# Patient Record
Sex: Female | Born: 1935 | Race: White | Hispanic: No | Smoking: Current every day smoker
Health system: Southern US, Community
[De-identification: ages and names within clinical notes are randomized; demographics above are authoritative.]

## PROBLEM LIST (undated history)

## (undated) DIAGNOSIS — F17200 Nicotine dependence, unspecified, uncomplicated: Secondary | ICD-10-CM

## (undated) DIAGNOSIS — E119 Type 2 diabetes mellitus without complications: Secondary | ICD-10-CM

## (undated) DIAGNOSIS — E785 Hyperlipidemia, unspecified: Secondary | ICD-10-CM

## (undated) DIAGNOSIS — M810 Age-related osteoporosis without current pathological fracture: Secondary | ICD-10-CM

## (undated) DIAGNOSIS — I10 Essential (primary) hypertension: Secondary | ICD-10-CM

## (undated) DIAGNOSIS — S61409A Unspecified open wound of unspecified hand, initial encounter: Secondary | ICD-10-CM

## (undated) DIAGNOSIS — I872 Venous insufficiency (chronic) (peripheral): Secondary | ICD-10-CM

## (undated) DIAGNOSIS — Z9079 Acquired absence of other genital organ(s): Secondary | ICD-10-CM

## (undated) HISTORY — DX: Nicotine dependence, unspecified, uncomplicated: F17.200

## (undated) HISTORY — DX: Age-related osteoporosis without current pathological fracture: M81.0

## (undated) HISTORY — DX: Unspecified open wound of unspecified hand, initial encounter: S61.409A

## (undated) HISTORY — DX: Type 2 diabetes mellitus without complications: E11.9

## (undated) HISTORY — DX: Acquired absence of other genital organ(s): Z90.79

## (undated) HISTORY — DX: Hypercalcemia: E83.52

## (undated) HISTORY — DX: Venous insufficiency (chronic) (peripheral): I87.2

## (undated) HISTORY — DX: Essential (primary) hypertension: I10

## (undated) HISTORY — DX: Hyperlipidemia, unspecified: E78.5

---

## 1999-10-15 ENCOUNTER — Encounter: Payer: Self-pay | Admitting: Emergency Medicine

## 1999-10-16 ENCOUNTER — Inpatient Hospital Stay (HOSPITAL_COMMUNITY): Admission: EM | Admit: 1999-10-16 | Discharge: 1999-10-18 | Payer: Self-pay | Admitting: Emergency Medicine

## 1999-10-24 ENCOUNTER — Encounter: Admission: RE | Admit: 1999-10-24 | Discharge: 1999-10-24 | Payer: Self-pay | Admitting: Internal Medicine

## 1999-10-25 ENCOUNTER — Encounter: Admission: RE | Admit: 1999-10-25 | Discharge: 2000-01-23 | Payer: Self-pay | Admitting: *Deleted

## 1999-11-02 ENCOUNTER — Encounter: Admission: RE | Admit: 1999-11-02 | Discharge: 1999-11-02 | Payer: Self-pay | Admitting: Internal Medicine

## 1999-11-07 ENCOUNTER — Encounter: Admission: RE | Admit: 1999-11-07 | Discharge: 1999-11-07 | Payer: Self-pay | Admitting: Internal Medicine

## 1999-11-08 ENCOUNTER — Encounter: Admission: RE | Admit: 1999-11-08 | Discharge: 1999-11-08 | Payer: Self-pay | Admitting: Hematology and Oncology

## 1999-11-17 ENCOUNTER — Encounter: Payer: Self-pay | Admitting: Emergency Medicine

## 1999-11-17 ENCOUNTER — Emergency Department (HOSPITAL_COMMUNITY): Admission: EM | Admit: 1999-11-17 | Discharge: 1999-11-17 | Payer: Self-pay | Admitting: Emergency Medicine

## 1999-12-21 ENCOUNTER — Encounter: Admission: RE | Admit: 1999-12-21 | Discharge: 1999-12-21 | Payer: Self-pay | Admitting: Internal Medicine

## 2000-01-02 ENCOUNTER — Ambulatory Visit (HOSPITAL_COMMUNITY): Admission: RE | Admit: 2000-01-02 | Discharge: 2000-01-02 | Payer: Self-pay | Admitting: Internal Medicine

## 2000-01-02 ENCOUNTER — Encounter: Payer: Self-pay | Admitting: Internal Medicine

## 2000-03-28 ENCOUNTER — Encounter: Admission: RE | Admit: 2000-03-28 | Discharge: 2000-03-28 | Payer: Self-pay

## 2000-07-17 ENCOUNTER — Encounter: Admission: RE | Admit: 2000-07-17 | Discharge: 2000-07-17 | Payer: Self-pay | Admitting: Internal Medicine

## 2000-08-16 ENCOUNTER — Encounter: Admission: RE | Admit: 2000-08-16 | Discharge: 2000-08-16 | Payer: Self-pay | Admitting: Internal Medicine

## 2000-09-14 ENCOUNTER — Encounter: Admission: RE | Admit: 2000-09-14 | Discharge: 2000-09-14 | Payer: Self-pay | Admitting: Internal Medicine

## 2001-03-22 ENCOUNTER — Encounter: Admission: RE | Admit: 2001-03-22 | Discharge: 2001-03-22 | Payer: Self-pay | Admitting: Internal Medicine

## 2001-04-30 ENCOUNTER — Encounter: Admission: RE | Admit: 2001-04-30 | Discharge: 2001-04-30 | Payer: Self-pay

## 2001-07-12 ENCOUNTER — Encounter: Admission: RE | Admit: 2001-07-12 | Discharge: 2001-07-12 | Payer: Self-pay | Admitting: Internal Medicine

## 2001-07-30 ENCOUNTER — Encounter: Admission: RE | Admit: 2001-07-30 | Discharge: 2001-07-30 | Payer: Self-pay | Admitting: Internal Medicine

## 2001-08-01 ENCOUNTER — Ambulatory Visit (HOSPITAL_COMMUNITY): Admission: RE | Admit: 2001-08-01 | Discharge: 2001-08-01 | Payer: Self-pay | Admitting: Internal Medicine

## 2001-08-01 ENCOUNTER — Encounter: Payer: Self-pay | Admitting: Internal Medicine

## 2002-05-02 ENCOUNTER — Encounter: Admission: RE | Admit: 2002-05-02 | Discharge: 2002-05-02 | Payer: Self-pay | Admitting: Internal Medicine

## 2002-09-22 ENCOUNTER — Encounter: Admission: RE | Admit: 2002-09-22 | Discharge: 2002-09-22 | Payer: Self-pay | Admitting: Internal Medicine

## 2002-09-24 ENCOUNTER — Encounter: Admission: RE | Admit: 2002-09-24 | Discharge: 2002-09-24 | Payer: Self-pay | Admitting: Internal Medicine

## 2002-09-24 ENCOUNTER — Encounter: Payer: Self-pay | Admitting: Internal Medicine

## 2002-09-25 ENCOUNTER — Ambulatory Visit (HOSPITAL_COMMUNITY): Admission: RE | Admit: 2002-09-25 | Discharge: 2002-09-25 | Payer: Self-pay | Admitting: Internal Medicine

## 2002-09-26 ENCOUNTER — Encounter: Admission: RE | Admit: 2002-09-26 | Discharge: 2002-09-26 | Payer: Self-pay | Admitting: Internal Medicine

## 2003-02-04 ENCOUNTER — Encounter: Admission: RE | Admit: 2003-02-04 | Discharge: 2003-02-04 | Payer: Self-pay | Admitting: Internal Medicine

## 2003-03-20 ENCOUNTER — Encounter: Admission: RE | Admit: 2003-03-20 | Discharge: 2003-03-20 | Payer: Self-pay | Admitting: Internal Medicine

## 2003-12-25 ENCOUNTER — Emergency Department (HOSPITAL_COMMUNITY): Admission: EM | Admit: 2003-12-25 | Discharge: 2003-12-25 | Payer: Self-pay | Admitting: Emergency Medicine

## 2003-12-28 ENCOUNTER — Ambulatory Visit: Payer: Self-pay | Admitting: Internal Medicine

## 2004-02-04 ENCOUNTER — Ambulatory Visit: Payer: Self-pay | Admitting: Internal Medicine

## 2004-02-11 ENCOUNTER — Ambulatory Visit: Payer: Self-pay | Admitting: Internal Medicine

## 2004-02-17 ENCOUNTER — Ambulatory Visit (HOSPITAL_COMMUNITY): Admission: RE | Admit: 2004-02-17 | Discharge: 2004-02-17 | Payer: Self-pay | Admitting: *Deleted

## 2005-02-17 ENCOUNTER — Ambulatory Visit (HOSPITAL_COMMUNITY): Admission: RE | Admit: 2005-02-17 | Discharge: 2005-02-17 | Payer: Self-pay | Admitting: Internal Medicine

## 2005-03-17 ENCOUNTER — Ambulatory Visit: Payer: Self-pay | Admitting: Internal Medicine

## 2005-07-24 ENCOUNTER — Ambulatory Visit: Payer: Self-pay | Admitting: Internal Medicine

## 2005-08-01 ENCOUNTER — Ambulatory Visit: Payer: Self-pay | Admitting: Internal Medicine

## 2005-08-07 ENCOUNTER — Ambulatory Visit: Payer: Self-pay | Admitting: Internal Medicine

## 2005-08-11 ENCOUNTER — Ambulatory Visit (HOSPITAL_COMMUNITY): Admission: RE | Admit: 2005-08-11 | Discharge: 2005-08-11 | Payer: Self-pay | Admitting: Internal Medicine

## 2006-01-10 ENCOUNTER — Ambulatory Visit: Payer: Self-pay | Admitting: Internal Medicine

## 2006-02-21 ENCOUNTER — Ambulatory Visit (HOSPITAL_COMMUNITY): Admission: RE | Admit: 2006-02-21 | Discharge: 2006-02-21 | Payer: Self-pay | Admitting: Internal Medicine

## 2006-07-08 ENCOUNTER — Emergency Department (HOSPITAL_COMMUNITY): Admission: EM | Admit: 2006-07-08 | Discharge: 2006-07-08 | Payer: Self-pay | Admitting: Family Medicine

## 2006-07-10 DIAGNOSIS — Z87440 Personal history of urinary (tract) infections: Secondary | ICD-10-CM | POA: Insufficient documentation

## 2006-08-08 ENCOUNTER — Telehealth (INDEPENDENT_AMBULATORY_CARE_PROVIDER_SITE_OTHER): Payer: Self-pay | Admitting: *Deleted

## 2006-08-14 ENCOUNTER — Encounter (INDEPENDENT_AMBULATORY_CARE_PROVIDER_SITE_OTHER): Payer: Self-pay | Admitting: Unknown Physician Specialty

## 2006-08-14 ENCOUNTER — Ambulatory Visit: Payer: Self-pay | Admitting: Internal Medicine

## 2006-08-14 DIAGNOSIS — F17200 Nicotine dependence, unspecified, uncomplicated: Secondary | ICD-10-CM | POA: Insufficient documentation

## 2006-08-14 DIAGNOSIS — Z9079 Acquired absence of other genital organ(s): Secondary | ICD-10-CM

## 2006-08-14 DIAGNOSIS — E119 Type 2 diabetes mellitus without complications: Secondary | ICD-10-CM

## 2006-08-14 DIAGNOSIS — I10 Essential (primary) hypertension: Secondary | ICD-10-CM

## 2006-08-14 DIAGNOSIS — R05 Cough: Secondary | ICD-10-CM

## 2006-08-14 DIAGNOSIS — E785 Hyperlipidemia, unspecified: Secondary | ICD-10-CM

## 2006-08-14 DIAGNOSIS — I872 Venous insufficiency (chronic) (peripheral): Secondary | ICD-10-CM

## 2006-08-14 DIAGNOSIS — M810 Age-related osteoporosis without current pathological fracture: Secondary | ICD-10-CM | POA: Insufficient documentation

## 2006-08-14 HISTORY — DX: Type 2 diabetes mellitus without complications: E11.9

## 2006-08-14 HISTORY — DX: Venous insufficiency (chronic) (peripheral): I87.2

## 2006-08-14 HISTORY — DX: Age-related osteoporosis without current pathological fracture: M81.0

## 2006-08-14 HISTORY — DX: Nicotine dependence, unspecified, uncomplicated: F17.200

## 2006-08-14 HISTORY — DX: Hyperlipidemia, unspecified: E78.5

## 2006-08-14 HISTORY — DX: Essential (primary) hypertension: I10

## 2006-08-14 HISTORY — DX: Acquired absence of other genital organ(s): Z90.79

## 2006-08-14 LAB — CONVERTED CEMR LAB
Blood Glucose, Fingerstick: 187
Hgb A1c MFr Bld: 7.6 %

## 2006-08-16 ENCOUNTER — Ambulatory Visit: Payer: Self-pay | Admitting: Internal Medicine

## 2006-08-16 ENCOUNTER — Encounter (INDEPENDENT_AMBULATORY_CARE_PROVIDER_SITE_OTHER): Payer: Self-pay | Admitting: Unknown Physician Specialty

## 2006-08-16 LAB — CONVERTED CEMR LAB
ALT: 13 units/L (ref 0–35)
AST: 15 units/L (ref 0–37)
Albumin: 3.9 g/dL (ref 3.5–5.2)
Alkaline Phosphatase: 72 units/L (ref 39–117)
BUN: 19 mg/dL (ref 6–23)
CO2: 26 meq/L (ref 19–32)
Calcium: 10 mg/dL (ref 8.4–10.5)
Chloride: 102 meq/L (ref 96–112)
Cholesterol: 169 mg/dL (ref 0–200)
Creatinine, Ser: 0.97 mg/dL (ref 0.40–1.20)
Glucose, Bld: 179 mg/dL — ABNORMAL HIGH (ref 70–99)
HDL: 48 mg/dL (ref >39)
LDL Cholesterol: 93 mg/dL (ref 0–99)
Potassium: 4.1 meq/L (ref 3.5–5.3)
Sodium: 139 meq/L (ref 135–145)
Total Bilirubin: 0.5 mg/dL (ref 0.3–1.2)
Total CHOL/HDL Ratio: 3.5
Total Protein: 7.2 g/dL (ref 6.0–8.3)
Triglycerides: 142 mg/dL (ref <150)
VLDL: 28 mg/dL (ref 0–40)

## 2006-08-28 ENCOUNTER — Ambulatory Visit: Payer: Self-pay | Admitting: Hospitalist

## 2007-02-27 ENCOUNTER — Ambulatory Visit (HOSPITAL_COMMUNITY): Admission: RE | Admit: 2007-02-27 | Discharge: 2007-02-27 | Payer: Self-pay | Admitting: Infectious Diseases

## 2007-03-12 ENCOUNTER — Telehealth: Payer: Self-pay | Admitting: *Deleted

## 2007-03-15 ENCOUNTER — Encounter (INDEPENDENT_AMBULATORY_CARE_PROVIDER_SITE_OTHER): Payer: Self-pay | Admitting: Internal Medicine

## 2007-03-15 ENCOUNTER — Ambulatory Visit: Payer: Self-pay

## 2007-03-15 DIAGNOSIS — N390 Urinary tract infection, site not specified: Secondary | ICD-10-CM

## 2007-03-17 LAB — CONVERTED CEMR LAB
Ketones, ur: NEGATIVE mg/dL
Nitrite: NEGATIVE
Protein, ur: NEGATIVE mg/dL
Specific Gravity, Urine: 1.021 (ref 1.005–1.03)
Urine Glucose: NEGATIVE mg/dL
Urobilinogen, UA: 1 (ref 0.0–1.0)
pH: 6 (ref 5.0–8.0)

## 2007-04-09 ENCOUNTER — Telehealth: Payer: Self-pay | Admitting: *Deleted

## 2007-08-01 ENCOUNTER — Ambulatory Visit: Payer: Self-pay | Admitting: Internal Medicine

## 2007-08-01 DIAGNOSIS — S61409A Unspecified open wound of unspecified hand, initial encounter: Secondary | ICD-10-CM

## 2007-08-01 HISTORY — DX: Unspecified open wound of unspecified hand, initial encounter: S61.409A

## 2007-08-01 LAB — CONVERTED CEMR LAB
Blood Glucose, Fingerstick: 142
Hgb A1c MFr Bld: 7 %

## 2007-08-08 ENCOUNTER — Ambulatory Visit: Payer: Self-pay | Admitting: Internal Medicine

## 2007-08-08 ENCOUNTER — Encounter (INDEPENDENT_AMBULATORY_CARE_PROVIDER_SITE_OTHER): Payer: Self-pay | Admitting: Internal Medicine

## 2007-08-08 LAB — CONVERTED CEMR LAB
ALT: 9 units/L (ref 0–35)
AST: 13 units/L (ref 0–37)
Albumin: 4.3 g/dL (ref 3.5–5.2)
Alkaline Phosphatase: 79 units/L (ref 39–117)
BUN: 17 mg/dL (ref 6–23)
Basophils Absolute: 0 10*3/uL (ref 0.0–0.1)
Basophils Relative: 0 % (ref 0–1)
Bilirubin, Direct: 0.1 mg/dL (ref 0.0–0.3)
CO2: 26 meq/L (ref 19–32)
Calcium: 10.6 mg/dL — ABNORMAL HIGH (ref 8.4–10.5)
Chloride: 101 meq/L (ref 96–112)
Cholesterol: 187 mg/dL (ref 0–200)
Creatinine, Ser: 0.92 mg/dL (ref 0.40–1.20)
Eosinophils Absolute: 0.3 10*3/uL (ref 0.0–0.7)
Eosinophils Relative: 4 % (ref 0–5)
Glucose, Bld: 136 mg/dL — ABNORMAL HIGH (ref 70–99)
HCT: 43.8 % (ref 36.0–46.0)
HDL: 48 mg/dL (ref >39)
Hemoglobin: 14.5 g/dL (ref 12.0–15.0)
Indirect Bilirubin: 0.4 mg/dL (ref 0.0–0.9)
LDL Cholesterol: 103 mg/dL — ABNORMAL HIGH (ref 0–99)
Lymphocytes Relative: 32 % (ref 12–46)
Lymphs Abs: 2.2 10*3/uL (ref 0.7–4.0)
MCHC: 33.1 g/dL (ref 30.0–36.0)
MCV: 91.4 fL (ref 78.0–100.0)
Monocytes Absolute: 0.5 10*3/uL (ref 0.1–1.0)
Monocytes Relative: 7 % (ref 3–12)
Neutro Abs: 3.9 10*3/uL (ref 1.7–7.7)
Neutrophils Relative %: 57 % (ref 43–77)
Platelets: 231 10*3/uL (ref 150–400)
Potassium: 3.9 meq/L (ref 3.5–5.3)
RBC: 4.79 M/uL (ref 3.87–5.11)
RDW: 14.1 % (ref 11.5–15.5)
Sodium: 140 meq/L (ref 135–145)
Total Bilirubin: 0.5 mg/dL (ref 0.3–1.2)
Total CHOL/HDL Ratio: 3.9
Total Protein: 7.5 g/dL (ref 6.0–8.3)
Triglycerides: 182 mg/dL — ABNORMAL HIGH (ref <150)
VLDL: 36 mg/dL (ref 0–40)
WBC: 6.9 10*3/uL (ref 4.0–10.5)

## 2008-03-03 ENCOUNTER — Ambulatory Visit (HOSPITAL_COMMUNITY): Admission: RE | Admit: 2008-03-03 | Discharge: 2008-03-03 | Payer: Self-pay | Admitting: Infectious Diseases

## 2008-03-11 ENCOUNTER — Encounter (INDEPENDENT_AMBULATORY_CARE_PROVIDER_SITE_OTHER): Payer: Self-pay | Admitting: Internal Medicine

## 2008-03-11 ENCOUNTER — Ambulatory Visit: Payer: Self-pay | Admitting: Internal Medicine

## 2008-03-11 LAB — CONVERTED CEMR LAB
ALT: 15 units/L (ref 0–35)
AST: 17 units/L (ref 0–37)
Albumin: 4.2 g/dL (ref 3.5–5.2)
Alkaline Phosphatase: 75 units/L (ref 39–117)
BUN: 20 mg/dL (ref 6–23)
Blood Glucose, Fingerstick: 162
CO2: 26 meq/L (ref 19–32)
Calcium: 10.3 mg/dL (ref 8.4–10.5)
Chloride: 101 meq/L (ref 96–112)
Creatinine, Ser: 0.95 mg/dL (ref 0.40–1.20)
Creatinine, Urine: 96.5 mg/dL
Glucose, Bld: 145 mg/dL — ABNORMAL HIGH (ref 70–99)
Hgb A1c MFr Bld: 7.2 %
Microalb Creat Ratio: 18.6 mg/g (ref 0.0–30.0)
Microalb, Ur: 1.79 mg/dL (ref 0.00–1.89)
Potassium: 3.5 meq/L (ref 3.5–5.3)
Sodium: 139 meq/L (ref 135–145)
Total Bilirubin: 0.5 mg/dL (ref 0.3–1.2)
Total Protein: 7.1 g/dL (ref 6.0–8.3)

## 2008-03-24 ENCOUNTER — Ambulatory Visit: Payer: Self-pay | Admitting: Internal Medicine

## 2008-03-24 LAB — CONVERTED CEMR LAB: OCCULT 3: NEGATIVE

## 2008-08-18 ENCOUNTER — Telehealth (INDEPENDENT_AMBULATORY_CARE_PROVIDER_SITE_OTHER): Payer: Self-pay | Admitting: Internal Medicine

## 2009-02-09 ENCOUNTER — Telehealth: Payer: Self-pay | Admitting: *Deleted

## 2009-02-15 ENCOUNTER — Telehealth (INDEPENDENT_AMBULATORY_CARE_PROVIDER_SITE_OTHER): Payer: Self-pay | Admitting: Internal Medicine

## 2009-02-22 ENCOUNTER — Telehealth: Payer: Self-pay | Admitting: *Deleted

## 2009-03-09 ENCOUNTER — Ambulatory Visit: Payer: Self-pay | Admitting: Internal Medicine

## 2009-03-09 ENCOUNTER — Encounter (INDEPENDENT_AMBULATORY_CARE_PROVIDER_SITE_OTHER): Payer: Self-pay | Admitting: Internal Medicine

## 2009-03-09 LAB — CONVERTED CEMR LAB
ALT: 9 units/L (ref 0–35)
AST: 13 units/L (ref 0–37)
Albumin: 4.4 g/dL (ref 3.5–5.2)
Alkaline Phosphatase: 72 units/L (ref 39–117)
BUN: 26 mg/dL — ABNORMAL HIGH (ref 6–23)
Blood Glucose, Fingerstick: 138
CO2: 25 meq/L (ref 19–32)
Calcium: 10.5 mg/dL (ref 8.4–10.5)
Chloride: 104 meq/L (ref 96–112)
Cholesterol: 153 mg/dL (ref 0–200)
Creatinine, Ser: 1.17 mg/dL (ref 0.40–1.20)
Glucose, Bld: 142 mg/dL — ABNORMAL HIGH (ref 70–99)
HDL: 45 mg/dL (ref >39)
Hgb A1c MFr Bld: 6.8 %
LDL Cholesterol: 77 mg/dL (ref 0–99)
Potassium: 3.9 meq/L (ref 3.5–5.3)
Sodium: 142 meq/L (ref 135–145)
Total Bilirubin: 0.5 mg/dL (ref 0.3–1.2)
Total CHOL/HDL Ratio: 3.4
Total Protein: 7.2 g/dL (ref 6.0–8.3)
Triglycerides: 157 mg/dL — ABNORMAL HIGH (ref <150)
VLDL: 31 mg/dL (ref 0–40)

## 2009-03-23 ENCOUNTER — Ambulatory Visit (HOSPITAL_COMMUNITY): Admission: RE | Admit: 2009-03-23 | Discharge: 2009-03-23 | Payer: Self-pay | Admitting: Internal Medicine

## 2009-03-30 ENCOUNTER — Encounter (INDEPENDENT_AMBULATORY_CARE_PROVIDER_SITE_OTHER): Payer: Self-pay | Admitting: Internal Medicine

## 2009-04-19 ENCOUNTER — Telehealth (INDEPENDENT_AMBULATORY_CARE_PROVIDER_SITE_OTHER): Payer: Self-pay | Admitting: Internal Medicine

## 2009-04-21 ENCOUNTER — Encounter (INDEPENDENT_AMBULATORY_CARE_PROVIDER_SITE_OTHER): Payer: Self-pay | Admitting: Internal Medicine

## 2009-04-23 ENCOUNTER — Encounter (INDEPENDENT_AMBULATORY_CARE_PROVIDER_SITE_OTHER): Payer: Self-pay | Admitting: Internal Medicine

## 2009-08-16 ENCOUNTER — Telehealth (INDEPENDENT_AMBULATORY_CARE_PROVIDER_SITE_OTHER): Payer: Self-pay | Admitting: *Deleted

## 2009-08-27 ENCOUNTER — Telehealth (INDEPENDENT_AMBULATORY_CARE_PROVIDER_SITE_OTHER): Payer: Self-pay | Admitting: Internal Medicine

## 2009-09-07 ENCOUNTER — Telehealth (INDEPENDENT_AMBULATORY_CARE_PROVIDER_SITE_OTHER): Payer: Self-pay | Admitting: Internal Medicine

## 2009-09-21 ENCOUNTER — Ambulatory Visit: Payer: Self-pay | Admitting: Internal Medicine

## 2009-09-21 LAB — CONVERTED CEMR LAB
Blood Glucose, Fingerstick: 170
Hgb A1c MFr Bld: 6.7 %

## 2009-09-27 LAB — CONVERTED CEMR LAB
ALT: 11 units/L (ref 0–35)
AST: 16 units/L (ref 0–37)
Albumin: 4.3 g/dL (ref 3.5–5.2)
Alkaline Phosphatase: 60 units/L (ref 39–117)
BUN: 21 mg/dL (ref 6–23)
CO2: 28 meq/L (ref 19–32)
Calcium: 11.2 mg/dL — ABNORMAL HIGH (ref 8.4–10.5)
Chloride: 101 meq/L (ref 96–112)
Creatinine, Ser: 1.18 mg/dL (ref 0.40–1.20)
Glucose, Bld: 155 mg/dL — ABNORMAL HIGH (ref 70–99)
Potassium: 3.9 meq/L (ref 3.5–5.3)
Sodium: 139 meq/L (ref 135–145)
Total Bilirubin: 0.5 mg/dL (ref 0.3–1.2)
Total Protein: 7.2 g/dL (ref 6.0–8.3)

## 2009-10-08 ENCOUNTER — Ambulatory Visit: Payer: Self-pay | Admitting: Internal Medicine

## 2009-10-08 HISTORY — DX: Hypercalcemia: E83.52

## 2009-10-08 LAB — CONVERTED CEMR LAB
BUN: 25 mg/dL — ABNORMAL HIGH (ref 6–23)
CO2: 30 meq/L (ref 19–32)
Calcium: 10.4 mg/dL (ref 8.4–10.5)
Chloride: 102 meq/L (ref 96–112)
Cholesterol, target level: 200 mg/dL
Creatinine, Ser: 1.42 mg/dL — ABNORMAL HIGH (ref 0.40–1.20)
Glucose, Bld: 153 mg/dL — ABNORMAL HIGH (ref 70–99)
HDL goal, serum: 40 mg/dL
LDL Goal: 100 mg/dL
Potassium: 3.8 meq/L (ref 3.5–5.3)
Sodium: 142 meq/L (ref 135–145)

## 2009-10-26 ENCOUNTER — Encounter (INDEPENDENT_AMBULATORY_CARE_PROVIDER_SITE_OTHER): Payer: Self-pay | Admitting: *Deleted

## 2009-12-31 ENCOUNTER — Telehealth: Payer: Self-pay | Admitting: Internal Medicine

## 2010-04-06 ENCOUNTER — Telehealth (INDEPENDENT_AMBULATORY_CARE_PROVIDER_SITE_OTHER): Payer: Self-pay | Admitting: *Deleted

## 2010-05-12 NOTE — Progress Notes (Signed)
Summary: refill/ hla  Phone Note Refill Request Message from:  Fax from Pharmacy on December 31, 2009 4:10 PM  Refills Requested: Medication #1:  SIMVASTATIN 80 MG TABS Take 1 tablet by mouth once a day   Dosage confirmed as above?Dosage Confirmed   Last Refilled: 5/28 last visit 7/1, last labs 6/14  Initial call taken by: Marin Roberts RN,  December 31, 2009 4:10 PM  Follow-up for Phone Call        Refill approved-nurse to complete Follow-up by: Julaine Fusi  DO,  December 31, 2009 4:24 PM    Prescriptions: SIMVASTATIN 80 MG TABS (SIMVASTATIN) Take 1 tablet by mouth once a day  #90 x 0   Entered and Authorized by:   Julaine Fusi  DO   Signed by:   Julaine Fusi  DO on 12/31/2009   Method used:   [facility]        [facility] [address] [phone]* (retail)       [address]       [place], [place]  [address]       Ph: [phone] or [phone]       Fax: [phone]   RxID:   [id number]

## 2010-05-12 NOTE — Assessment & Plan Note (Signed)
Summary: REASSIGNED NEW TO/CFB   Vital Signs:  Patient profile:   77 year old female Height:      61 inches (154.94 cm) Weight:      126.1 pounds (57.32 kg) BMI:     23.91 Temp:     97.2 degrees F (36.22 degrees C) oral Pulse rate:   69 / minute BP sitting:   142 / 60  (right arm) Cuff size:   regular  Vitals Entered By: Cynda Familia Duncan Dull) (October 08, 2009 1:30 PM)  CC: pt here to f/u abn labs from 6/14 where she was noted to have a calcium of 11.2, med refill, Hypertension Management, Lipid Management Is Patient Diabetic? Yes Did you bring your meter with you today? No Pain Assessment Patient in pain? no      Nutritional Status BMI of 19 -24 = normal  Have you ever been in a relationship where you felt threatened, hurt or afraid?No   Does patient need assistance? Functional Status Self care Ambulation Normal   Primary Care Provider:  Whitney Post MD  CC:  pt here to f/u abn labs from 6/14 where she was noted to have a calcium of 11.2, med refill, Hypertension Management, and Lipid Management.  History of Present Illness:       Leah Kaufman is a 77yo woman who presents today for follow-up of high calcium (11.2) at last visit. She is currently holding her Calcium +Vit D supplements. She denies constipation, muscle aches, weakness, bone pain, weight loss, or other symptoms of hypercalcemia or malignancy.      She also complains of contact dermatitis x 1 week on her hands secondary to poison oak. She has been treating her hands with a paste made from oxygen soap and water. She says that these symptoms (redness, itching) are resolving. She is advised that she may use over-the-counter topical 1% hydrocortisone if desired.   Hypertension History:      Positive major cardiovascular risk factors include female age 75 years old or older, diabetes, hyperlipidemia, hypertension, and current tobacco user.    Lipid Management History:      Positive NCEP/ATP III risk factors include  female age 75 years old or older, diabetes, current tobacco user, and hypertension.      Preventive Screening-Counseling & Management  Alcohol-Tobacco     Alcohol drinks/day: 0     Smoking Status: current     Smoking Cessation Counseling: yes     Packs/Day: 1-1.5  ppd     Year Started: AT THE OF 23  Current Medications (verified): 1)  Simvastatin 80 Mg Tabs (Simvastatin) .... Take 1 Tablet By Mouth Once A Day 2)  Enalapril Maleate 20 Mg Tabs (Enalapril Maleate) .... Take 1 Tablet By Mouth Once A Day 3)  Glipizide 5 Mg Tabs (Glipizide) .... Take 2 Tab in The Morning and One Tab At Night. 4)  Hydrochlorothiazide 50 Mg Tabs (Hydrochlorothiazide) .... Take 1 Tablet By Mouth Once A Day 5)  Lopressor 100 Mg Tabs (Metoprolol Tartrate) .... Take 1 Tablet By Mouth Two Times A Day 6)  Fosamax 70 Mg Tabs (Alendronate Sodium) .... Take Once Weekly 7)  Aspirin 81 Mg Tbec (Aspirin) .... Take 1 Tablet By Mouth Once A Day 8)  Calcium 600+d 600-400 Mg-Unit Tabs (Calcium Carbonate-Vitamin D) .... 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 Single, widow Current Smoker - -1-1.5 ppd 50+yrs Alcohol use-no Had 6 children (oldest  son deceased) and several grandchildren and great-grandchildren Lives w/ grandson and his partner at present, but they will soon be moving out.  Review of Systems      See HPI General:  Denies malaise, weakness, and weight loss. ENT:  Complains of decreased hearing; She complains of mild decreased hearing but is not concerned by it and does not desire further action at this time.. CV:  Denies fatigue, lightheadness, and palpitations. Resp:  Complains of cough; denies shortness of breath, sputum productive, and wheezing; Pt complains of a mild, chronic dry cough, which she attributes to smoking. Marland Kitchen GI:  Denies abdominal pain and change in bowel habits. GU:  Complains of incontinence;  denies dysuria; Complains of occassional stress incontinence, which she manages with adult diapers and is currently satisfied with this approach.. MS:  Denies joint pain and muscle weakness.  Physical Exam  General:  alert, well-developed, well-nourished, and cooperative to examination.   Head:  normocephalic and atraumatic.   Eyes:  pupils equal, pupils round, and pupils reactive to light.   Ears:  R ear normal and L ear normal.   Nose:  no external deformity.   Mouth:  no lesions and teeth missing.   Neck:  supple and no masses.   Chest Wall:  no tenderness and no mass.   Lungs:  normal respiratory effort, R diffuse crackles, and L base crackles.   Heart:  normal rate, regular rhythm, no murmur, no gallop, and no rub.   Abdomen:  soft, non-tender, and normal bowel sounds.   Extremities:  trace left pedal edema and trace right pedal edema. Feet slightly erythematous but skin is intact.   Neurologic:  alert & oriented X3.    Diabetes Management Exam:    Eye Exam:       Eye Exam done elsewhere   Impression & Recommendations:  Problem # 1:  HYPERCALCEMIA (ICD-275.42) High calcium (11.2) at last visit. Patient denies symptoms of hypercalcemia or malignancy. Re-check today. Patient should continue to hold calcium supplementation until advised to restart. If calcium remains high, patient will be asked to return for further work-up (PTH, ionized Ca, etc).  Orders: T-Basic Metabolic Panel [phone])  Problem # 2:  DIABETES MELLITUS, TYPE II (ICD-250.00) Well-controlled. HbA1C is at goal. Patient is being followed annually by an opthalmologist. Pt was counseled on to examine her feet daily. Continue current medication regimen.  Her updated medication list for this problem includes:    Enalapril Maleate 20 Mg Tabs (Enalapril maleate) .Marland Kitchen... Take 1 tablet by mouth once a day    Glipizide 5 Mg Tabs (Glipizide) .Marland Kitchen... Take 2 tab in the morning and one tab at night.    Aspirin 81 Mg Tbec  (Aspirin) .Marland Kitchen... Take 1 tablet by mouth once a day  Orders: Ophthalmology Referral (Ophthalmology)  Problem # 3:  HYPERTENSION (ICD-401.9) BP not quite at goal. Continue current regimen and re-evaluate in 3 months at next visit.  Her updated medication list for this problem includes:    Enalapril Maleate 20 Mg Tabs (Enalapril maleate) .Marland Kitchen... Take 1 tablet by mouth once a day    Hydrochlorothiazide 50 Mg Tabs (Hydrochlorothiazide) .Marland Kitchen... Take 1 tablet by mouth once a day    Lopressor 100 Mg Tabs (Metoprolol tartrate) .Marland Kitchen... Take 1 tablet by mouth two times a day  Problem # 4:  HYPERLIPIDEMIA (ICD-272.4) Well-controlled. LDL at goal. Continue current management.   Her updated medication list for this problem includes:    Simvastatin 80 Mg Tabs (Simvastatin) .Marland KitchenMarland KitchenMarland KitchenMarland Kitchen  Take 1 tablet by mouth once a day  Problem # 5:  OSTEOPOROSIS (ICD-733.00) Continue Fosamax. After serum calcium is rechecked, will decide whether to restart calcium supplements.   Her updated medication list for this problem includes:    Fosamax 70 Mg Tabs (Alendronate sodium) .Marland Kitchen... Take once weekly  Complete Medication List: 1)  Simvastatin 80 Mg Tabs (Simvastatin) .... Take 1 tablet by mouth once a day 2)  Enalapril Maleate 20 Mg Tabs (Enalapril maleate) .... Take 1 tablet by mouth once a day 3)  Glipizide 5 Mg Tabs (Glipizide) .... Take 2 tab in the morning and one tab at night. 4)  Hydrochlorothiazide 50 Mg Tabs (Hydrochlorothiazide) .... Take 1 tablet by mouth once a day 5)  Lopressor 100 Mg Tabs (Metoprolol tartrate) .... Take 1 tablet by mouth two times a day 6)  Fosamax 70 Mg Tabs (Alendronate sodium) .... Take once weekly 7)  Aspirin 81 Mg Tbec (Aspirin) .... Take 1 tablet by mouth once a day 8)  Calcium 600+d 600-400 Mg-unit Tabs (Calcium carbonate-vitamin d) .... On hold, hypercalcemia on bmet  Hypertension Assessment/Plan:      The patient's hypertensive risk group is category C: Target organ damage and/or  diabetes.  Her calculated 10 year risk of coronary heart disease is 27 %.  Today's blood pressure is 142/60.    Lipid Assessment/Plan:      Based on NCEP/ATP III, the patient's risk factor category is "history of diabetes".  The patient's lipid goals are as follows: Total cholesterol goal is 200; LDL cholesterol goal is 100; HDL cholesterol goal is 40; Triglyceride goal is 150.     Patient Instructions: 1)  Please schedule a follow-up appointment in 3 months. Process Orders Check Orders Results:     [facility]: Check successful Tests Sent for requisitioning (October 08, 2009 3:15 PM):     10/08/2009: [facility] -- T-Basic Metabolic Panel [phone] (signed)    Prevention & Chronic Care Immunizations   Influenza vaccine: Fluvax MCR  (03/15/2007)   Influenza vaccine deferral: Deferred  (10/08/2009)   Influenza vaccine due: 12/09/2009    Tetanus booster: 08/01/2007: Td    Pneumococcal vaccine: Not documented   Pneumococcal vaccine deferral: Deferred  (09/21/2009)    H. zoster vaccine: Not documented   H. zoster vaccine deferral: Deferred  (09/21/2009)  Colorectal Screening   Hemoccult: Not documented   Hemoccult action/deferral: Not indicated  (10/08/2009)    Colonoscopy: small internal hemorrhoids two sessile polyps, removed by hot snare sigmoid diverticulosis reduntant colon   (04/28/2009)   Colonoscopy action/deferral: await path results avoid nsaids continued surveillance high fiber diet   (04/28/2009)  Other Screening   Pap smear: Not documented   Pap smear action/deferral: Not indicated-other  (10/08/2009)    Mammogram: ASSESSMENT: Negative - BI-RADS 1^MM DIGITAL SCREENING  (03/23/2009)   Mammogram action/deferral: Ordered  (03/09/2009)   Mammogram due: 03/2009    DXA bone density scan: Not documented   DXA bone density action/deferral: Deferred  (10/08/2009)   Smoking status: current  (10/08/2009)   Smoking cessation  counseling: yes  (10/08/2009)  Diabetes Mellitus   HgbA1C: 6.7  (09/21/2009)    Eye exam: Not documented   Diabetic eye exam action/deferral: Ophthalmology referral  (10/08/2009)    Foot exam: yes  (03/09/2009)   Foot exam action/deferral: Do today   High risk foot: Not documented   Foot care education: Not documented    Urine microalbumin/creatinine ratio: 18.6  (03/11/2008)   Urine microalbumin  action/deferral: Not indicated    Diabetes flowsheet reviewed?: Yes   Progress toward A1C goal: At goal  Lipids   Total Cholesterol: 153  (03/09/2009)   Lipid panel action/deferral: Lipid Panel ordered   LDL: 77  (03/09/2009)   LDL Direct: Not documented   HDL: 45  (03/09/2009)   Triglycerides: 157  (03/09/2009)    SGOT (AST): 16  (09/21/2009)   BMP action: Ordered   SGPT (ALT): 11  (09/21/2009)   Alkaline phosphatase: 60  (09/21/2009)   Total bilirubin: 0.5  (09/21/2009)    Lipid flowsheet reviewed?: Yes   Progress toward LDL goal: At goal  Hypertension   Last Blood Pressure: 142 / 60  (10/08/2009)   Serum creatinine: 1.18  (09/21/2009)   BMP action: Ordered   Serum potassium 3.9  (09/21/2009)    Hypertension flowsheet reviewed?: Yes   Progress toward BP goal: Unchanged  Self-Management Support :   Personal Goals (by the next clinic visit) :     Personal A1C goal: 7  (09/21/2009)     Personal blood pressure goal: 130/80  (09/21/2009)     Personal LDL goal: 100  (09/21/2009)    Patient will work on the following items until the next clinic visit to reach self-care goals:     Medications and monitoring: take my medicines every day  (10/08/2009)     Eating: drink diet soda or water instead of juice or soda, eat foods that are low in salt, eat baked foods instead of fried foods  (10/08/2009)    Diabetes self-management support: Pre-printed educational material, Resources for patients handout, Written self-care plan  (10/08/2009)   Diabetes care plan printed     Hypertension self-management support: Pre-printed educational material, Resources for patients handout, Written self-care plan  (10/08/2009)   Hypertension self-care plan printed.    Lipid self-management support: Pre-printed educational material, Resources for patients handout, Written self-care plan  (10/08/2009)   Lipid self-care plan printed.      Resource handout printed.   Nursing Instructions: Refer for screening diabetic eye exam (see order) peter dunn or get prior report   Appended Document: REASSIGNED NEW TO/CFB Please bill visit as Est. Level IV.   Appended Document: REASSIGNED NEW TO/CFB I have discussed the care of this patient in detail with the resident and agree fully with the documentation completed.

## 2010-05-12 NOTE — Consult Note (Signed)
Summary: [facility]  [facility]   Imported By: Florinda Marker 04/15/2009 15:39:50  _____________________________________________________________________  External Attachment:    Type:   Image     Comment:   External Document

## 2010-05-12 NOTE — Progress Notes (Signed)
Summary: Refill/gh  Phone Note Refill Request Message from:  Patient on Aug 27, 2009 11:16 AM  Refills Requested: Medication #1:  LOPRESSOR 100 MG TABS Take 1 tablet by mouth two times a day Has an upcoming appointment for 09/21/2009.  Last viist and labs 03/09/2009.    Method Requested: Electronic Initial call taken by: Angelina Ok RN,  Aug 27, 2009 11:16 AM    Prescriptions: LOPRESSOR 100 MG TABS (METOPROLOL TARTRATE) Take 1 tablet by mouth two times a day  #90 x 1   Entered and Authorized by:   Zara Council MD   Signed by:   Zara Council MD on 09/01/2009   Method used:   [facility]        [facility] [address] [phone]* (retail)       [address]       [place], [place]  [address]       Ph: [phone] or [phone]       Fax: [phone]   RxID:   [id number]

## 2010-05-12 NOTE — Letter (Signed)
Summary: SURGICAL PATHOLOGY REPORT  SURGICAL PATHOLOGY REPORT   Imported By: Margie Billet 09/23/2009 11:11:36  _____________________________________________________________________  External Attachment:    Type:   Image     Comment:   External Document

## 2010-05-12 NOTE — Progress Notes (Signed)
Summary: Refill/gh  Phone Note Refill Request Message from:  Fax from Pharmacy on April 06, 2010 11:28 AM  Refills Requested: Medication #1:  SIMVASTATIN 80 MG TABS Take 1 tablet by mouth once a day   Last Refilled: 12/31/2009 Last office visit was 10/08/2009.  Labs were done on 08/21/2009 1nd 02/27/2009.   Method Requested: Electronic Initial call taken by: Angelina Ok RN,  April 06, 2010 11:29 AM    Prescriptions: SIMVASTATIN 80 MG TABS (SIMVASTATIN) Take 1 tablet by mouth once a day  #90 x 3   Entered and Authorized by:   Zoila Shutter MD   Signed by:   Zoila Shutter MD on 04/06/2010   Method used:   Electronically to        [facility] [address] [phone]* (retail)       [address]       [place], [place]  [address]       Ph: [phone] or [phone]       Fax: [phone]   RxID:   [id number]

## 2010-05-12 NOTE — Progress Notes (Signed)
Summary: med refill/gp  Phone Note Refill Request Message from:  Fax from Pharmacy on Aug 16, 2009 12:00 PM  Refills Requested: Medication #1:  HYDROCHLOROTHIAZIDE 50 MG TABS Take 1 tablet by mouth once a day   Last Refilled: 05/21/2009  Medication #2:  ENALAPRIL MALEATE 20 MG TABS Take 1 tablet by mouth once a day   Last Refilled: 05/21/2009 Last appt. Nov 30. w/labs.   Method Requested: Electronic Initial call taken by: Chinita Pester RN,  Aug 16, 2009 12:00 PM  Follow-up for Phone Call        Will fill but Needs to make followup appt within the next 2 months Acey Lav MD  Aug 16, 2009 2:06 PM  Follow-up by: Paulette Blanch Dam MD,  Aug 16, 2009 2:06 PM    Prescriptions: HYDROCHLOROTHIAZIDE 50 MG TABS (HYDROCHLOROTHIAZIDE) Take 1 tablet by mouth once a day  #93 x 0   Entered and Authorized by:   Acey Lav MD   Signed by:   Paulette Blanch Dam MD on 08/16/2009   Method used:   Electronically to        [facility] [address] [phone]* (retail)       [address]       [place], [place]  [address]       Ph: [phone] or [phone]       Fax: [phone]   RxID:   [id number] ENALAPRIL MALEATE 20 MG TABS (ENALAPRIL MALEATE) Take 1 tablet by mouth once a day  #93 x 0   Entered and Authorized by:   Acey Lav MD   Signed by:   Paulette Blanch Dam MD on 08/16/2009   Method used:   Electronically to        [facility] [address] [phone]* (retail)       [address]       [place], [place]  [address]       Ph: [phone] or [phone]       Fax: [phone]   RxID:   [id number]

## 2010-05-12 NOTE — Procedures (Signed)
Summary: [facility]: Colonoscopy  [facility]: Colonoscopy   Imported By: Florinda Marker 05/05/2009 14:37:39  _____________________________________________________________________  External Attachment:    Type:   Image     Comment:   External Document  Appended Document: [facility]: Colonoscopy    Clinical Lists Changes  Observations: Added new observation of COLONRECACT: await path results avoid nsaids continued surveillance high fiber diet  (04/28/2009 12:36) Added new observation of COLONOSCOPY: small internal hemorrhoids two sessile polyps, removed by hot snare sigmoid diverticulosis reduntant colon  (04/28/2009 12:36)       Colonoscopy  Procedure date:  04/28/2009  Findings:      small internal hemorrhoids two sessile polyps, removed by hot snare sigmoid diverticulosis reduntant colon   Comments:      await path results avoid nsaids continued surveillance high fiber diet    Colonoscopy  Procedure date:  04/28/2009  Findings:      small internal hemorrhoids two sessile polyps, removed by hot snare sigmoid diverticulosis reduntant colon   Comments:      await path results avoid nsaids continued surveillance high fiber diet

## 2010-05-12 NOTE — Assessment & Plan Note (Signed)
Summary: EST-CK/FU/MEDS/CFB   Vital Signs:  Patient profile:   77 year old female Height:      61 inches (154.94 cm) Weight:      124.2 pounds (56.45 kg) BMI:     23.55 Temp:     96.5 degrees F oral Pulse rate:   61 / minute BP sitting:   146 / 63  (right arm)  Vitals Entered By: Chinita Pester RN (September 21, 2009 1:38 PM) CC: Check-up.  Is Patient Diabetic? Yes Did you bring your meter with you today? No Pain Assessment Patient in pain? no      Nutritional Status BMI of 19 -24 = normal CBG Result 170  Have you ever been in a relationship where you felt threatened, hurt or afraid?No   Does patient need assistance? Functional Status Self care Ambulation Normal   Primary Care Provider:  Zara Council MD  CC:  Check-up. Marland Kitchen  History of Present Illness: Leah Kaufman is a 77 year old Female with PMH/problems as outlined in the EMR, who presents to the [facility] with chief complaint(s) of:    1. DM: taking meds as prescribed. Going to make eye referral for herself.  2. HTN: taking meds as prescribed. No new problems 3. Needs med refills. No new complaints.   Depression History:      The patient denies a depressed mood most of the day and a diminished interest in her usual daily activities.         Preventive Screening-Counseling & Management  Alcohol-Tobacco     Alcohol drinks/day: 0     Smoking Status: current     Smoking Cessation Counseling: yes     Packs/Day: 1-1.5  ppd     Year Started: AT THE OF 23  Caffeine-Diet-Exercise     Does Patient Exercise: no  Current Medications (verified): 1)  Simvastatin 80 Mg Tabs (Simvastatin) .... Take 1 Tablet By Mouth Once A Day 2)  Enalapril Maleate 20 Mg Tabs (Enalapril Maleate) .... Take 1 Tablet By Mouth Once A Day 3)  Glipizide 5 Mg Tabs (Glipizide) .... Take 2 Tab in The Morning and One Tab At Night. 4)  Hydrochlorothiazide 50 Mg Tabs (Hydrochlorothiazide) .... Take 1 Tablet By Mouth Once A Day 5)  Lopressor 100 Mg Tabs  (Metoprolol Tartrate) .... Take 1 Tablet By Mouth Two Times A Day 6)  Fosamax 70 Mg Tabs (Alendronate Sodium) .... Take Once Weekly 7)  Aspirin 81 Mg Tbec (Aspirin) .... Take 1 Tablet By Mouth Once A Day 8)  Calcium 600+d 600-400 Mg-Unit Tabs (Calcium Carbonate-Vitamin D) .... Take 1 Tablet By Mouth Two Times A Day  Allergies (verified): No Known Drug Allergies  Past History:  Past Medical History: Last updated: 08/01/2007 UTI (ICD-599.0) Hx of HX, URINARY INFECTION (ICD-V13.02) COUGH (ICD-786.2) HYPERTENSION (ICD-401.9) HYSTERECTOMY, HX OF (ICD-V45.77) TOBACCO ABUSE (ICD-305.1) HYPERLIPIDEMIA (ICD-272.4) VENOUS INSUFFICIENCY (ICD-459.81) OSTEOPOROSIS (ICD-733.00) DIABETES MELLITUS, TYPE II (ICD-250.00)    Family History: Last updated: 08/14/2006 Not significant at this age in terms of DM, HTN, CAD, CVA. Cancer - Dad, not sure which kind  Social History: Last updated: 03/09/2009 Retired Single, widow Current Smoker - 1+ ppd 50+yrs Alcohol use-no Lives w/ grandson and his partner  Risk Factors: Alcohol Use: 0 (09/21/2009) Exercise: no (09/21/2009)  Risk Factors: Smoking Status: current (09/21/2009) Packs/Day: 1-1.5  ppd (09/21/2009)  Social History: Packs/Day:  1-1.5  ppd  Review of Systems      See HPI  Physical Exam  General:  alert and well-developed.  Head:  normocephalic and atraumatic.  Mouth:  pharynx pink and moist.   Neck:  supple.   Abdomen:  soft and non-tender.   Pulses:  normal peripheral pulses  Extremities:  no cyanosis, clubbing or edema  Neurologic:  non focal.  Psych:  Oriented X3 and normally interactive.     Impression & Recommendations:  Problem # 1:  DIABETES MELLITUS, TYPE II (ICD-250.00) A1c: 6.7 (09/21/2009 1:24:07 PM)  MICROALB/CR: 18.6 (03/11/2008 8:22:00 PM) LDL: 77 (03/09/2009 8:56:00 PM) EYE: patient is going to make an appointment herself.  FOOT: yes (03/09/2009 9:41:14 AM) No changes today.   Her updated  medication list for this problem includes:    Enalapril Maleate 20 Mg Tabs (Enalapril maleate) .Marland Kitchen... Take 1 tablet by mouth once a day    Glipizide 5 Mg Tabs (Glipizide) .Marland Kitchen... Take 2 tab in the morning and one tab at night.    Aspirin 81 Mg Tbec (Aspirin) .Marland Kitchen... Take 1 tablet by mouth once a day  Orders: T- Capillary Blood Glucose ([address]) T-Hgb A1C (in-house) (60454UJ) T-Comprehensive Metabolic Panel ([id number])  Her updated medication list for this problem includes:    Enalapril Maleate 20 Mg Tabs (Enalapril maleate) .Marland Kitchen... Take 1 tablet by mouth once a day    Glipizide 5 Mg Tabs (Glipizide) .Marland Kitchen... Take 2 tab in the morning and one tab at night.    Aspirin 81 Mg Tbec (Aspirin) .Marland Kitchen... Take 1 tablet by mouth once a day  Problem # 2:  HYPERTENSION (ICD-401.9) Well-controlled. Continue the current regimen.  Check b-met today.  Her updated medication list for this problem includes:    Enalapril Maleate 20 Mg Tabs (Enalapril maleate) .Marland Kitchen... Take 1 tablet by mouth once a day    Hydrochlorothiazide 50 Mg Tabs (Hydrochlorothiazide) .Marland Kitchen... Take 1 tablet by mouth once a day    Lopressor 100 Mg Tabs (Metoprolol tartrate) .Marland Kitchen... Take 1 tablet by mouth two times a day  Her updated medication list for this problem includes:    Enalapril Maleate 20 Mg Tabs (Enalapril maleate) .Marland Kitchen... Take 1 tablet by mouth once a day    Hydrochlorothiazide 50 Mg Tabs (Hydrochlorothiazide) .Marland Kitchen... Take 1 tablet by mouth once a day    Lopressor 100 Mg Tabs (Metoprolol tartrate) .Marland Kitchen... Take 1 tablet by mouth two times a day  Orders: T-Comprehensive Metabolic Panel ([id number])  Problem # 3:  HYPERLIPIDEMIA (ICD-272.4) Well-controlled. Continue the current regimen.   Chol: 153 (03/09/2009 8:56:00 PM)HDL:  45 (03/09/2009 8:56:00 PM)LDL:  77 (03/09/2009 8:56:00 PM)Tri:   AST:  13 (03/09/2009 8:56:00 PM) ALT:  9 (03/09/2009 8:56:00 PM)T. Bili:  0.5 (03/09/2009 8:56:00 PM) AP:  72 (03/09/2009 8:56:00 PM)   Her updated medication  list for this problem includes:    Simvastatin 80 Mg Tabs (Simvastatin) .Marland Kitchen... Take 1 tablet by mouth once a day  Her updated medication list for this problem includes:    Simvastatin 80 Mg Tabs (Simvastatin) .Marland Kitchen... Take 1 tablet by mouth once a day  Orders: T-Comprehensive Metabolic Panel ([id number])  Complete Medication List: 1)  Simvastatin 80 Mg Tabs (Simvastatin) .... Take 1 tablet by mouth once a day 2)  Enalapril Maleate 20 Mg Tabs (Enalapril maleate) .... Take 1 tablet by mouth once a day 3)  Glipizide 5 Mg Tabs (Glipizide) .... Take 2 tab in the morning and one tab at night. 4)  Hydrochlorothiazide 50 Mg Tabs (Hydrochlorothiazide) .... Take 1 tablet by mouth once a day 5)  Lopressor 100 Mg Tabs (Metoprolol tartrate) .... Take 1 tablet  by mouth two times a day 6)  Fosamax 70 Mg Tabs (Alendronate sodium) .... Take once weekly 7)  Aspirin 81 Mg Tbec (Aspirin) .... Take 1 tablet by mouth once a day 8)  Calcium 600+d 600-400 Mg-unit Tabs (Calcium carbonate-vitamin d) .... Take 1 tablet by mouth two times a day  Patient Instructions: 1)  Please schedule a follow-up appointment in 6 months. 2)  We will let you know if anything wrong with your lab work.   Prescriptions: FOSAMAX 70 MG TABS (ALENDRONATE SODIUM) Take once weekly  #26 x 1   Entered and Authorized by:   Zara Council MD   Signed by:   Zara Council MD on 09/21/2009   Method used:   [facility]        [facility] [address] [phone]* (retail)       [address]       [place], [place]  [address]       Ph: [phone] or [phone]       Fax: [phone]   RxID:   [id number] LOPRESSOR 100 MG TABS (METOPROLOL TARTRATE) Take 1 tablet by mouth two times a day  #180 x 3   Entered and Authorized by:   Zara Council MD   Signed by:   Zara Council MD on 09/21/2009   Method used:   [facility]        [facility] [address] [phone]* (retail)       [address]       [place], [place]  [address]       Ph: [phone] or [phone]       Fax: [phone]   RxID:   [id number] HYDROCHLOROTHIAZIDE 50 MG TABS (HYDROCHLOROTHIAZIDE) Take 1 tablet by mouth once a day  #90 x 3   Entered and Authorized by:   Zara Council MD   Signed by:   Zara Council MD on 09/21/2009   Method used:   [facility]        [facility] [address] [phone]* (retail)       [address]       [place], [place]  [address]       Ph: [phone] or [phone]       Fax: [phone]   RxID:   [id number] GLIPIZIDE 5 MG TABS (GLIPIZIDE) Take 2 tab in the morning and one tab at night.  #270 x 3   Entered and Authorized by:   Zara Council MD   Signed by:   Zara Council MD on 09/21/2009   Method used:   [facility]ron[facility] to        [facility] [address] [phone]* (retail)       [address]       [place], [place]  [address]       Ph: [phone] or [phone]       Fax: [phone]   RxID:   [id number] ENALAPRIL MALEATE 20 MG TABS (ENALAPRIL MALEATE) Take 1 tablet by mouth once a day  #90 x 3   Entered and Authorized by:   Zara Council MD   Signed by:   Zara Council MD on 09/21/2009   Method used:   [facility]ically to        [facility] [address] [phone]* (retail)  [address]       [place], [place]  [address]       Ph: [phone] or [phone]       Fax: [phone]   RxID:   [id number] SIMVASTATIN 80 MG TABS (SIMVASTATIN) Take 1 tablet by mouth once a day  #90 x 3   Entered and Authorized by:   Zara Council MD   Signed by:   Zara Council MD on 09/21/2009   Method used:   [facility] to        [facility] [address] [phone]* (retail)       [address]       [place], [place]  [address]       Ph: [phone] or [phone]       Fax: [phone]   RxID:   [id number]   Prevention & Chronic Care Immunizations   Influenza vaccine:  Fluvax MCR  (03/15/2007)   Influenza vaccine due: 12/09/2009    Tetanus booster: 08/01/2007: Td    Pneumococcal vaccine: Not documented   Pneumococcal vaccine deferral: Deferred  (09/21/2009)    H. zoster vaccine: Not documented   H. zoster vaccine deferral: Deferred  (09/21/2009)  Colorectal Screening   Hemoccult: Not documented    Colonoscopy: small internal hemorrhoids two sessile polyps, removed by hot snare sigmoid diverticulosis reduntant colon   (04/28/2009)   Colonoscopy action/deferral: await path results avoid nsaids continued surveillance high fiber diet   (04/28/2009)  Other Screening   Pap smear: Not documented    Mammogram: ASSESSMENT: Negative - BI-RADS 1^MM DIGITAL SCREENING  (03/23/2009)   Mammogram action/deferral: Ordered  (03/09/2009)   Mammogram due: 03/2009    DXA bone density scan: Not documented   Smoking status: current  (09/21/2009)   Smoking cessation counseling: yes  (09/21/2009)  Diabetes Mellitus   HgbA1C: 6.7  (09/21/2009)    Eye exam: Not documented    Foot exam: yes  (03/09/2009)   Foot exam action/deferral: Do today   High risk foot: Not documented   Foot care education: Not documented    Urine microalbumin/creatinine ratio: 18.6  (03/11/2008)    Diabetes flowsheet reviewed?: Yes   Progress toward A1C goal: Unchanged  Lipids   Total Cholesterol: 153  (03/09/2009)   Lipid panel action/deferral: Lipid Panel ordered   LDL: 77  (03/09/2009)   LDL Direct: Not documented   HDL: 45  (03/09/2009)   Triglycerides: 157  (03/09/2009)    SGOT (AST): 13  (03/09/2009)   BMP action: Ordered   SGPT (ALT): 9  (03/09/2009) CMP ordered    Alkaline phosphatase: 72  (03/09/2009)   Total bilirubin: 0.5  (03/09/2009)    Lipid flowsheet reviewed?: Yes   Progress toward LDL goal: At goal  Hypertension   Last Blood Pressure: 146 / 63  (09/21/2009)   Serum creatinine: 1.17  (03/09/2009)   BMP action: Ordered   Serum potassium 3.9   (03/09/2009) CMP ordered     Hypertension flowsheet reviewed?: Yes   Progress toward BP goal: Unchanged  Self-Management Support :   Personal Goals (by the next clinic visit) :     Personal A1C goal: 7  (09/21/2009)     Personal blood pressure goal: 130/80  (09/21/2009)     Personal LDL goal: 100  (09/21/2009)    Patient will work on the following items until the next clinic visit to reach self-care goals:  Medications and monitoring: take my medicines every day, check my blood sugar, bring all of my medications to every visit, examine my feet every day  (09/21/2009)     Eating: drink diet soda or water instead of juice or soda, eat more vegetables, use fresh or frozen vegetables, eat foods that are low in salt, eat baked foods instead of fried foods, eat fruit for snacks and desserts  (09/21/2009)    Diabetes self-management support: Resources for patients handout, Written self-care plan  (09/21/2009)   Diabetes care plan printed    Hypertension self-management support: Resources for patients handout, Written self-care plan  (09/21/2009)   Hypertension self-care plan printed.    Lipid self-management support: Resources for patients handout, Written self-care plan  (09/21/2009)   Lipid self-care plan printed.      Resource handout printed.  Process Orders Check Orders Results:     [facility]: Check successful Tests Sent for requisitioning (September 21, 2009 2:44 PM):     09/21/2009: [facility] -- T-Comprehensive Metabolic Panel [[id number]] (signed)    Laboratory Results   Blood Tests   Date/Time Received: September 21, 2009 2:01 PM Date/Time Reported: Alric Quan  September 21, 2009 2:01 PM   HGBA1C: 6.7%   (Normal Range: Non-Diabetic - 3-6%   Control Diabetic - 6-8%) CBG Random:: 170mg /dL

## 2010-05-12 NOTE — Consult Note (Signed)
Summary: DR Davina Poke  DR Davina Poke   Imported By: Louretta Parma 11/10/2009 16:24:16  _____________________________________________________________________  External Attachment:    Type:   Image     Comment:   External Document  Appended Document: DR Davina Poke   Diabetic Eye Exam  Procedure date:  10/26/2009  Findings:      No diabetic retinopathy.     Procedures Next Due Date:    Diabetic Eye Exam: 11/2010   Diabetic Eye Exam  Procedure date:  10/26/2009  Findings:      No diabetic retinopathy.     Procedures Next Due Date:    Diabetic Eye Exam: 11/2010

## 2010-05-12 NOTE — Progress Notes (Signed)
Summary: med refill/gp  Phone Note Refill Request Message from:  Fax from Pharmacy on April 19, 2009 3:21 PM  Refills Requested: Medication #1:  FOSAMAX 70 MG TABS Take once weekly   Last Refilled: 01/25/2009  Method Requested: Electronic Initial call taken by: Chinita Pester RN,  April 19, 2009 3:21 PM    Prescriptions: FOSAMAX 70 MG TABS (ALENDRONATE SODIUM) Take once weekly  #26 x 1   Entered and Authorized by:   Zara Council MD   Signed by:   Zara Council MD on 04/19/2009   Method used:   [facility]        [facility] [address] [phone]* (retail)       [address]       [place], [place]  [address]       Ph: [phone] or [phone]       Fax: [phone]   RxID:   [id number]

## 2010-05-12 NOTE — Progress Notes (Signed)
Summary: med refill/gp  Phone Note Refill Request Message from:  Fax from Pharmacy on Sep 07, 2009 10:17 AM  Refills Requested: Medication #1:  GLIPIZIDE 5 MG TABS Take 2 tab in the morning and one tab at night.   Last Refilled: 05/19/2009 Next appt. June 14.   Method Requested: Electronic Initial call taken by: Chinita Pester RN,  Sep 07, 2009 10:17 AM    Prescriptions: GLIPIZIDE 5 MG TABS (GLIPIZIDE) Take 2 tab in the morning and one tab at night.  #90 x 6   Entered and Authorized by:   Zara Council MD   Signed by:   Zara Council MD on 09/07/2009   Method used:   [facility]ronically to        [facility] [address] [phone]* (retail)       [address]       [place], [place]  [address]       Ph: [phone] or [phone]       Fax: [phone]   RxID:   [id number]

## 2010-06-27 LAB — GLUCOSE, CAPILLARY: Glucose-Capillary: 170 mg/dL — ABNORMAL HIGH (ref 70–99)

## 2010-07-13 LAB — GLUCOSE, CAPILLARY: Glucose-Capillary: 138 mg/dL — ABNORMAL HIGH (ref 70–99)

## 2010-08-12 ENCOUNTER — Other Ambulatory Visit: Payer: Self-pay | Admitting: *Deleted

## 2010-08-12 MED ORDER — ALENDRONATE SODIUM 70 MG PO TABS: 70.0000 mg | ORAL_TABLET | ORAL | Status: DC

## 2010-08-12 NOTE — Telephone Encounter (Signed)
Sent to [employer] to schedule appointment

## 2010-08-12 NOTE — Telephone Encounter (Signed)
Last visit 10/08/09

## 2010-08-26 NOTE — Discharge Summary (Signed)
[facility]  Patient:    Leah Kaufman, Leah Kaufman                     MRN: [id number] Adm. Date:  [id number] Disc. Date: [id number] Attending:  Alfonso Ramus Dictator:   Nancee Liter, M.D.                           Discharge Summary  DATE OF BIRTH:  02/10/1929  DISCHARGE DIAGNOSES: 1. Abdominal pain, likely a viral gastroenteritis. 2. Diabetes mellitus.  DISCHARGE MEDICATIONS: 1. Metronidazole 500 mg 1 p.o. b.i.d. x 11 days. 2. Ciprofloxacin 500 mg 1 p.o. b.i.d. x 11 days. 3. Phenergan 12.5 mg 1 p.o./p.r. q.6h. p.r.n. severe nausea. 4. Calcium tablets 1 per day as before hospitalization. 5. Tylenol 500 mg 1 p.o. q.4-6h. p.r.n. pain.  FOLLOW-UP:  October 24, 1999, with Dr. Nancee Liter.  Also, October 25, 1999, with the outpatient diabetes treatment program.  Please check blood sugar and follow new-onset diabetes mellitus.  PROCEDURES:  None.  CONSULTATIONS:  None.  HISTORY OF PRESENT ILLNESS:  This is a 77 year old white female with no significant past medical history who presented with one day of abdominal pain, nausea, vomiting, anorexia, diarrhea, frequent urination.  Denied fever and chills.  Exam showed her to be afebrile.  Her abdominal exam was mildly distended, soft, tender left upper quadrant, with positive but decreased bowel sounds.  Otherwise exam was normal.  LABORATORY DATA:  UA was negative.  Chem-7 133, 3.7, 100, 2.6, 10, 0.6, 219. Liver function tests:  Total protein 7.2, albumin 3.7, AST 19, ALT 19, alkaline phosphatase 151, total bilirubin 0.7.  White blood count 11.9, ANC 8.8, hemoglobin 16.1, and 317 platelets.  Lipase was 25.  On admission, CT of the abdomen showed diverticulitis and stranding.  HOSPITAL COURSE:  She was admitted with the diagnosis of diverticulitis, treated with IV Cipro and Flagyl and was also treated with Levsin and Demerol for pain control as well as Phenergan for nausea.  She was also  diagnosed with hypoglycemia, and hemoglobin A1C was checked.  Her hospital course was complicated by repeated fevers as well as white blood cell count which increased to 18.5 on October 17, 1999.  Hemoglobin A1C came back at 9.9.  The patient received diabetic teaching as well as outpatient follow-up scheduled. The patient was treated with IV fluids for dehydration, and her headache resolved.  Upon review, admission CT was reread as positive diverticulosis without evidence of diverticulitis, and diagnosis was changed to suspicion of gastroenteritis, likely viral.  At that point, antibiotics were discontinued. Unfortunately, on October 18, 1999, the patients white blood cell count was still elevated at 12.6.  The patient was still symptomatic without change, so antibiotics were restarted at that point.  The patient was clinically stable and afebrile and was discharged home on October 18, 1999.  She is scheduled to follow up in the outpatient clinic and with diabetic outpatient teaching as well as scheduled to take Flagyl and Cipro as above.  DISCHARGE LABORATORY DATA:  On October 17, 1999, sodium 134, potassium 3.4, chloride 100, bicarbonate 27, BUN 6, creatinine 0.7, glucose 282, calcium 7.1. Hemoglobin A1C 9.9.  October 18, 1999, white blood cell count 12.6, ANC 10.3, hemoglobin 13.7, hematocrit 40.5, platelets 238.DD:  10/24/99 TD:  10/24/99 Job: [id number]

## 2010-09-02 ENCOUNTER — Ambulatory Visit (INDEPENDENT_AMBULATORY_CARE_PROVIDER_SITE_OTHER): Payer: Medicare Other | Admitting: Internal Medicine

## 2010-09-02 ENCOUNTER — Encounter: Payer: Self-pay | Admitting: *Deleted

## 2010-09-02 ENCOUNTER — Encounter: Payer: Self-pay | Admitting: Internal Medicine

## 2010-09-02 VITALS — BP 125/64 | HR 77 | Temp 98.6°F | Ht 62.25 in | Wt 114.3 lb

## 2010-09-02 DIAGNOSIS — M810 Age-related osteoporosis without current pathological fracture: Secondary | ICD-10-CM

## 2010-09-02 DIAGNOSIS — Z1231 Encounter for screening mammogram for malignant neoplasm of breast: Secondary | ICD-10-CM

## 2010-09-02 DIAGNOSIS — R32 Unspecified urinary incontinence: Secondary | ICD-10-CM

## 2010-09-02 DIAGNOSIS — Z0000 Encounter for general adult medical examination without abnormal findings: Secondary | ICD-10-CM

## 2010-09-02 DIAGNOSIS — I10 Essential (primary) hypertension: Secondary | ICD-10-CM

## 2010-09-02 DIAGNOSIS — F17200 Nicotine dependence, unspecified, uncomplicated: Secondary | ICD-10-CM

## 2010-09-02 DIAGNOSIS — E119 Type 2 diabetes mellitus without complications: Secondary | ICD-10-CM

## 2010-09-02 DIAGNOSIS — E785 Hyperlipidemia, unspecified: Secondary | ICD-10-CM

## 2010-09-02 LAB — GLUCOSE, CAPILLARY: Glucose-Capillary: 223 mg/dL — ABNORMAL HIGH (ref 70–99)

## 2010-09-02 LAB — POCT GLYCOSYLATED HEMOGLOBIN (HGB A1C): Hemoglobin A1C: 6.9

## 2010-09-02 MED ORDER — METOPROLOL TARTRATE 100 MG PO TABS: 100.0000 mg | ORAL_TABLET | Freq: Two times a day (BID) | ORAL | Status: DC

## 2010-09-02 MED ORDER — GLIPIZIDE 5 MG PO TABS: 5.0000 mg | ORAL_TABLET | Freq: Two times a day (BID) | ORAL | Status: DC

## 2010-09-02 MED ORDER — ZOSTER VACCINE LIVE 19400 UNT/0.65ML ~~LOC~~ SOLR: 0.6500 mL | Freq: Once | SUBCUTANEOUS | Status: AC

## 2010-09-02 MED ORDER — ALENDRONATE SODIUM 70 MG PO TABS: 70.0000 mg | ORAL_TABLET | ORAL | Status: AC

## 2010-09-02 MED ORDER — ENALAPRIL MALEATE 20 MG PO TABS: 20.0000 mg | ORAL_TABLET | Freq: Every day | ORAL | Status: DC

## 2010-09-02 MED ORDER — SIMVASTATIN 40 MG PO TABS: 40.0000 mg | ORAL_TABLET | Freq: Every day | ORAL | Status: DC

## 2010-09-02 MED ORDER — HYDROCHLOROTHIAZIDE 50 MG PO TABS: 50.0000 mg | ORAL_TABLET | Freq: Every day | ORAL | Status: DC

## 2010-09-02 NOTE — Progress Notes (Signed)
Subjective:    Patient ID: Leah Kaufman, female    DOB: [birth year], 77 y.o.   MRN: [id number]  HPI [name] is an 77 year old woman with a history of hypertension diabetes osteoporosis and tobacco abuse who presents today for followup of: 1. DM. Takes glipizide. No symptoms of hyper or hypoglycemia.  2. HTN. Takes enalapril, HCTZ, and metoprolol. No headache, syncope, or lightheadedness.  3. Osteoporosis. Patient has been taking alendronate since 2008. Is well educated on how to take it (sitting up, half hour before eating). Last DEXA scan was in 2007, prior to initiating therapy.  4. Tobacco abuse. Smokes 1-1.5ppd. Has been smoking since age 72 and is not interested in quitting.  5. HL. Currently taking simvastatin 80mg . Denies myalgias.  -- will decrease to 40mg  daily.  6. Incontinence. Describes occassional stress or urge incontinence and wears adult diapers. However, she also says that she often could get to the bathroom in time but chooses not to if there's something she's interested in on tv. She is reasonably satisfied with control from adult diapers but complains that she occasionally gets chafing between her legs but has none presently.   Current Outpatient Prescriptions on File Prior to Visit  Medication Sig Dispense Refill  . aspirin 81 MG tablet Take 81 mg by mouth daily.        . Calcium Carbonate-Vitamin D (CALCIUM 600+D) 600-400 MG-UNIT per tablet Take 1 tablet by mouth daily.        . enalapril (VASOTEC) 20 MG tablet Take 1 tablet (20 mg total) by mouth daily.  90 tablet  3  . glipiZIDE (GLUCOTROL) 5 MG tablet Take 1 tablet (5 mg total) by mouth 2 (two) times daily before a meal. Take 2 tablets by mouth every morning and 1 tablet at night.  90 tablet  11  . hydrochlorothiazide 50 MG tablet Take 1 tablet (50 mg total) by mouth daily.  90 tablet  3  . metoprolol (LOPRESSOR) 100 MG tablet Take 1 tablet (100 mg total) by mouth 2 (two) times daily.  180 tablet  3  . simvastatin  (ZOCOR) 40 MG tablet Take 1 tablet (40 mg total) by mouth at bedtime.  90 tablet  3  . DISCONTD: alendronate (FOSAMAX) 70 MG tablet Take 1 tablet (70 mg total) by mouth every 7 (seven) days. Take with a full glass of water on an empty stomach.  4 tablet  2    Review of Systems  Constitutional: Negative for fever, chills, activity change, appetite change and fatigue.  HENT: Negative for congestion, sore throat and rhinorrhea.   Eyes: Negative for visual disturbance.  Respiratory: Negative for cough and shortness of breath.   Cardiovascular: Negative for chest pain and palpitations.  Gastrointestinal: Negative for nausea, vomiting, abdominal pain, diarrhea and constipation.  Genitourinary: Positive for urgency. Negative for dysuria, hematuria and flank pain.       Incontinence  Musculoskeletal: Negative for myalgias and back pain.  Skin: Negative for rash.  Neurological: Negative for dizziness, syncope, weakness, numbness and headaches.  Psychiatric/Behavioral: Negative for dysphoric mood. The patient is not nervous/anxious.   All other systems reviewed and are negative.       Objective:   Physical Exam  Vitals reviewed. Constitutional: She is oriented to person, place, and time. She appears well-developed and well-nourished. No distress.       Appears thin, wrinkled but pleasant and energetic.   HENT:  Head: Normocephalic and atraumatic.  Mouth/Throat: Oropharynx is clear and moist.  No oropharyngeal exudate.  Eyes: Conjunctivae and EOM are normal. Pupils are equal, round, and reactive to light.  Neck: Normal range of motion. Neck supple. No tracheal deviation present. No thyromegaly present.  Cardiovascular: Normal rate, regular rhythm, normal heart sounds and intact distal pulses.  Exam reveals no gallop and no friction rub.   No murmur heard. Pulmonary/Chest: Effort normal and breath sounds normal. No respiratory distress. She has no wheezes. She has no rales. She exhibits no  tenderness.  Abdominal: Soft. Bowel sounds are normal. She exhibits no distension and no mass. There is no tenderness. There is no rebound and no guarding.  Musculoskeletal: Normal range of motion. She exhibits no edema and no tenderness.  Neurological: She is alert and oriented to person, place, and time. No cranial nerve deficit.  Skin: Skin is warm and dry. No rash noted. She is not diaphoretic.  Psychiatric: She has a normal mood and affect. Her behavior is normal. Judgment and thought content normal.          Assessment & Plan:

## 2010-09-02 NOTE — Patient Instructions (Signed)
I recommend you quit smoking for your health and the health of those around you. Please let us know if we can help in your effort to quit.  We will call you about scheduling your bone scan.  Keep taking your medications every day and call us if you have questions.

## 2010-09-03 LAB — COMPREHENSIVE METABOLIC PANEL
ALT: <8 U/L (ref 0–35)
AST: 13 U/L (ref 0–37)
Albumin: 3.9 g/dL (ref 3.5–5.2)
BUN: 27 mg/dL — ABNORMAL HIGH (ref 6–23)
CO2: 28 mEq/L (ref 19–32)
Calcium: 10.8 mg/dL — ABNORMAL HIGH (ref 8.4–10.5)
Chloride: 100 mEq/L (ref 96–112)
Creat: 1.15 mg/dL (ref 0.40–1.20)
Glucose, Bld: 204 mg/dL — ABNORMAL HIGH (ref 70–99)
Potassium: 3.3 mEq/L — ABNORMAL LOW (ref 3.5–5.3)
Total Bilirubin: 0.5 mg/dL (ref 0.3–1.2)

## 2010-09-03 LAB — CBC
HCT: 41.8 % (ref 36.0–46.0)
Hemoglobin: 13.7 g/dL (ref 12.0–15.0)
MCH: 30.6 pg (ref 26.0–34.0)
MCHC: 32.8 g/dL (ref 30.0–36.0)
MCV: 93.3 fL (ref 78.0–100.0)
RDW: 13.5 % (ref 11.5–15.5)
WBC: 8 10*3/uL (ref 4.0–10.5)

## 2010-09-05 DIAGNOSIS — R32 Unspecified urinary incontinence: Secondary | ICD-10-CM | POA: Insufficient documentation

## 2010-09-05 NOTE — Assessment & Plan Note (Signed)
Well controlled on glipizide alone. HbA1C 6.9. Continue current management.

## 2010-09-05 NOTE — Assessment & Plan Note (Signed)
Patient has been taking simvastatin 80mg  daily. Although she is asymptomatic (no myalgias), her LDL is quite low (77) and I do not think it will greatly alter her future cardiovascular risk to decrease simvastatin to 40mg  daily and it may decrease her chance of developing adverse symptoms. Will decrease simvastatin to 40mg  po daily.

## 2010-09-05 NOTE — Assessment & Plan Note (Signed)
Patient has been taking alendronate since 2008 but the only record of a DEXA scan I can find was in 2007. Will order repeat DEXA scan to assess response to therapy. Patient should continue calcium supplements and alendronate. Patient briefly had hypercalcemia last year -- will recheck metabolic panel to ensure that this has not recurred.

## 2010-09-05 NOTE — Assessment & Plan Note (Signed)
Patient has difficulty characterizing her incontinence but describes what may be a combination of stress and urge symptoms (occassionally "leaking" urine and sometimes not able to get to the bathroom in time). She currently manages this with adult diapers and is reasonably satisfied with this. She also acknowledges that she sometimes chooses not to go to the bathroom even when she has time, as she would rather not miss a good program on tv. As a result, I do not think a medication would change this aspect of her life much. Therefore, I encouraged her to change the diapers frequently and whenever wet to avoid any problems with moisture-related skin irritation/breakdown.

## 2010-09-05 NOTE — Assessment & Plan Note (Signed)
BP within goal today. Continue current regimen.

## 2010-09-05 NOTE — Assessment & Plan Note (Signed)
Patient states that she is not interested in quitting smoking, although she acknowledges that it is very bad for her health. She says that she has smoked since she was 60 and is happy she has lived this long -- does not want to quit. I advised her that quitting smoking would be one of the best things she could do to improve her health, as smoking increases her risk of numerous life-threatening conditions, such as vascular disease and cancer. I also advised her that, If she changes her mind in the future and decides that she would like to quit, to please contact our office as we can offer several resources that may help.

## 2010-09-15 ENCOUNTER — Ambulatory Visit (HOSPITAL_COMMUNITY): Payer: Medicare Other

## 2010-09-15 ENCOUNTER — Ambulatory Visit (HOSPITAL_COMMUNITY): Admission: RE | Admit: 2010-09-15 | Payer: Medicare Other | Source: Ambulatory Visit

## 2010-10-04 ENCOUNTER — Ambulatory Visit (HOSPITAL_COMMUNITY)
Admission: RE | Admit: 2010-10-04 | Discharge: 2010-10-04 | Disposition: A | Discharge status: Home / Self Care | Payer: Medicare Other | Source: Ambulatory Visit | Attending: Internal Medicine | Admitting: Internal Medicine

## 2010-10-04 ENCOUNTER — Other Ambulatory Visit (INDEPENDENT_AMBULATORY_CARE_PROVIDER_SITE_OTHER): Payer: Medicare Other | Admitting: *Deleted

## 2010-10-04 DIAGNOSIS — M810 Age-related osteoporosis without current pathological fracture: Secondary | ICD-10-CM

## 2010-10-04 DIAGNOSIS — Z1231 Encounter for screening mammogram for malignant neoplasm of breast: Secondary | ICD-10-CM | POA: Insufficient documentation

## 2010-10-04 DIAGNOSIS — E119 Type 2 diabetes mellitus without complications: Secondary | ICD-10-CM

## 2010-10-04 MED ORDER — GLIPIZIDE 5 MG PO TABS: ORAL_TABLET | ORAL | Status: DC

## 2010-10-04 NOTE — Telephone Encounter (Signed)
Please call this patient and explain new instructions.  Arrange for visit and hemoglobin A1c in 3 months.

## 2010-10-04 NOTE — Telephone Encounter (Signed)
Please clarify instruction for Glipizide.Leah Kaufman

## 2010-10-04 NOTE — Telephone Encounter (Signed)
Pt was called; new instructions given to pt.  Then transferred to front desk for an appt. In 3 months.

## 2010-10-04 NOTE — Telephone Encounter (Signed)
Looking back to centricity the dose was 2 in the morning and 1 at night. I would check with pharmacy to see if that is a dose that the patient has been on for some time. Both of these doses were mentions in the last progress note in 5/12.

## 2010-11-21 ENCOUNTER — Encounter: Payer: Self-pay | Admitting: Internal Medicine

## 2011-01-12 LAB — GLUCOSE, CAPILLARY: Glucose-Capillary: 162 mg/dL — ABNORMAL HIGH (ref 70–99)

## 2011-01-30 ENCOUNTER — Other Ambulatory Visit: Payer: Self-pay | Admitting: *Deleted

## 2011-01-30 DIAGNOSIS — E119 Type 2 diabetes mellitus without complications: Secondary | ICD-10-CM

## 2011-01-31 MED ORDER — GLIPIZIDE 5 MG PO TABS: ORAL_TABLET | ORAL | Status: DC

## 2011-07-08 ENCOUNTER — Other Ambulatory Visit: Payer: Self-pay | Admitting: Internal Medicine

## 2011-09-09 ENCOUNTER — Other Ambulatory Visit: Payer: Self-pay | Admitting: Internal Medicine

## 2011-09-15 ENCOUNTER — Encounter: Payer: BC Managed Care – PPO | Admitting: Internal Medicine

## 2011-10-06 ENCOUNTER — Other Ambulatory Visit: Payer: Self-pay | Admitting: *Deleted

## 2011-10-06 DIAGNOSIS — I10 Essential (primary) hypertension: Secondary | ICD-10-CM

## 2011-10-06 MED ORDER — ENALAPRIL MALEATE 20 MG PO TABS: 20.0000 mg | ORAL_TABLET | Freq: Every day | ORAL | Status: DC

## 2011-10-06 MED ORDER — ALENDRONATE SODIUM 70 MG PO TABS: 70.0000 mg | ORAL_TABLET | ORAL | Status: DC

## 2011-10-06 MED ORDER — HYDROCHLOROTHIAZIDE 50 MG PO TABS: 50.0000 mg | ORAL_TABLET | Freq: Every day | ORAL | Status: DC

## 2011-10-06 NOTE — Telephone Encounter (Signed)
Refill is needed Alendronate sodium 70 mg #12 - last filled 01/11/11 and written 09/02/10

## 2011-11-13 ENCOUNTER — Ambulatory Visit (INDEPENDENT_AMBULATORY_CARE_PROVIDER_SITE_OTHER): Payer: Medicare Other | Admitting: Internal Medicine

## 2011-11-13 ENCOUNTER — Encounter: Payer: Self-pay | Admitting: Internal Medicine

## 2011-11-13 VITALS — BP 131/66 | HR 52 | Temp 96.5°F | Resp 20 | Ht 61.25 in | Wt 105.1 lb

## 2011-11-13 DIAGNOSIS — I10 Essential (primary) hypertension: Secondary | ICD-10-CM

## 2011-11-13 DIAGNOSIS — M810 Age-related osteoporosis without current pathological fracture: Secondary | ICD-10-CM

## 2011-11-13 DIAGNOSIS — E119 Type 2 diabetes mellitus without complications: Secondary | ICD-10-CM

## 2011-11-13 DIAGNOSIS — E785 Hyperlipidemia, unspecified: Secondary | ICD-10-CM

## 2011-11-13 LAB — LIPID PANEL
Cholesterol: 160 mg/dL (ref 0–200)
HDL: 48 mg/dL (ref >39)
Total CHOL/HDL Ratio: 3.3 Ratio
Triglycerides: 172 mg/dL — ABNORMAL HIGH (ref <150)

## 2011-11-13 MED ORDER — ALENDRONATE SODIUM 70 MG PO TABS: 70.0000 mg | ORAL_TABLET | ORAL | Status: DC

## 2011-11-13 MED ORDER — METOPROLOL TARTRATE 100 MG PO TABS: 100.0000 mg | ORAL_TABLET | Freq: Two times a day (BID) | ORAL | Status: DC

## 2011-11-13 MED ORDER — GLIPIZIDE 5 MG PO TABS: 5.0000 mg | ORAL_TABLET | Freq: Two times a day (BID) | ORAL | Status: DC

## 2011-11-13 MED ORDER — SIMVASTATIN 40 MG PO TABS: 40.0000 mg | ORAL_TABLET | Freq: Every day | ORAL | Status: DC

## 2011-11-13 MED ORDER — ENALAPRIL MALEATE 20 MG PO TABS: 20.0000 mg | ORAL_TABLET | Freq: Every day | ORAL | Status: DC

## 2011-11-13 MED ORDER — HYDROCHLOROTHIAZIDE 50 MG PO TABS: 50.0000 mg | ORAL_TABLET | Freq: Every day | ORAL | Status: DC

## 2011-11-13 NOTE — Assessment & Plan Note (Signed)
Will check lipid panel at today's visit however it was previously controlled on simvastatin dosing and will continue.

## 2011-11-13 NOTE — Progress Notes (Signed)
Subjective:     Patient ID: Leah Kaufman, female   DOB: [birth year], 78 y.o.   MRN: [id number]  HPI The patient is an 78 year old female who comes in today for her yearly followup visit. She states that she doesn't really come to the doctor that often because she doesn't really get sick that often. She is currently out of her glipizide and has been out for several days. All of her other medications also need to be refilled at today's visit. She has had several emotional losses within the last year or several children and a sister have died. She also lost one of her dogs. She states that she keeps in good humor and she does seem to have close family in town. She does live alone however states that that is not a problem for her. She does state that she has not fallen at home recently. She does have very long toenails and I did advise her to clip dose or to call us if she cannot so that we can arrange for those to be taken care of by a podiatrist. No complaints at today's visit. No chest pain, shortness of breath, nausea, vomiting, diarrhea.  Review of Systems  Constitutional: Negative.   Respiratory: Negative.  Negative for cough, chest tightness, shortness of breath and wheezing.   Cardiovascular: Negative.  Negative for chest pain, palpitations and leg swelling.  Gastrointestinal: Negative.   Musculoskeletal: Negative.   Skin: Negative.   Neurological: Negative.   Hematological: Negative.   Psychiatric/Behavioral: Negative.        Objective:   Physical Exam  Constitutional: She is oriented to person, place, and time. She appears well-developed and well-nourished.  HENT:  Head: Normocephalic and atraumatic.  Eyes: EOM are normal. Pupils are equal, round, and reactive to light.  Neck: Normal range of motion. Neck supple.  Cardiovascular: Normal rate and regular rhythm.   Pulmonary/Chest: Effort normal and breath sounds normal.  Abdominal: Soft. Bowel sounds are normal.  Musculoskeletal:  Normal range of motion.  Neurological: She is alert and oriented to person, place, and time.  Skin: Skin is warm and dry.  Psychiatric: She has a normal mood and affect. Her behavior is normal. Judgment and thought content normal.       Assessment/Plan:    1. Please see problem-oriented charting.  2. Disposition-the patient will be seen back in one year for repeat followup. Did stress with her that she should call us if she is unable to clip her toenails. Also stressed that A. she is feeling sick or needed anything to call us at any time and we would be available to her. No changes to her medical regimen at today's visit. We did check a lipid panel and a hemoglobin A1c and a microalbumin to creatinine ratio at today's visit.

## 2011-11-13 NOTE — Assessment & Plan Note (Signed)
Blood pressure 131/66 at today's visit. Well controlled on enalapril and metoprolol and hydrochlorothiazide. No changes to regimen at today's visit, we'll recheck in one year.

## 2011-11-13 NOTE — Patient Instructions (Signed)
You were seen for a check up today. We are not changing any of your medicines today. We have refilled all of your medicines. We will call you if there are any problems with any of your labs. Remember to clip your toenails and to continue to eat good meals every day. We will see you back in about 1 year unless you have problems before then. You can always call us at [phone].

## 2011-11-13 NOTE — Assessment & Plan Note (Signed)
Extremely well controlled with only glipizide twice daily. Hemoglobin A1c about 6.4 and no change regimen at today's visit. Will re\re followup in one year. Is on an ACE inhibitor, statin, aspirin daily.

## 2011-11-14 LAB — MICROALBUMIN / CREATININE URINE RATIO: Microalb Creat Ratio: 24.6 mg/g (ref 0.0–30.0)

## 2012-03-19 ENCOUNTER — Other Ambulatory Visit: Payer: Self-pay | Admitting: Internal Medicine

## 2012-04-09 ENCOUNTER — Encounter (HOSPITAL_COMMUNITY): Payer: Self-pay | Admitting: *Deleted

## 2012-04-09 ENCOUNTER — Emergency Department (INDEPENDENT_AMBULATORY_CARE_PROVIDER_SITE_OTHER)
Admission: EM | Admit: 2012-04-09 | Discharge: 2012-04-09 | Disposition: A | Discharge status: Home / Self Care | Payer: Medicare Other | Source: Home / Self Care

## 2012-04-09 DIAGNOSIS — K2990 Gastroduodenitis, unspecified, without bleeding: Secondary | ICD-10-CM

## 2012-04-09 DIAGNOSIS — K2970 Gastritis, unspecified, without bleeding: Secondary | ICD-10-CM

## 2012-04-09 MED ORDER — OMEPRAZOLE 20 MG PO CPDR: 20.0000 mg | DELAYED_RELEASE_CAPSULE | Freq: Every day | ORAL | Status: DC

## 2012-04-09 NOTE — ED Provider Notes (Signed)
History     CSN: [id number]  Arrival date & time 04/09/12  1846   First MD Initiated Contact with Patient 04/09/12 1944      Chief Complaint  Patient presents with  . Abdominal Pain    (Consider location/radiation/quality/duration/timing/severity/associated sxs/prior treatment) HPI Comments: 78 year old female who had vomiting for 2 days one week ago. She has improved since then and has had minimal vomiting. Yesterday she ate wheeze in her stomach hurt all night. She feels better this morning her chief complaint is that she was deeply knees without stomach pain. She also had diarrhea several days ago but that has also since cleared. She has no abdominal pain today. She has no nausea vomiting diarrhea or abdominal pain today. No fever, chills, shortness of breath, chest pain, or GU symptoms   Past Medical History  Diagnosis Date  . Hypertension   . Hyperlipidemia   . WOUND, HAND 08/01/2007  . OSTEOPOROSIS 08/14/2006  . HYSTERECTOMY, HX OF 08/14/2006  . Urinary incontinence 09/05/2010  . DIABETES MELLITUS, TYPE II 08/14/2006  . HYPERLIPIDEMIA 08/14/2006  . Hypercalcemia 10/08/2009  . TOBACCO ABUSE 08/14/2006  . HYPERTENSION 08/14/2006  . VENOUS INSUFFICIENCY 08/14/2006    History reviewed. No pertinent past surgical history.  Family History  Problem Relation Age of Onset  . Cancer Other   . Diabetes Other     History  Substance Use Topics  . Smoking status: Current Every Day Smoker -- 1.0 packs/day    Types: Cigarettes  . Smokeless tobacco: Not on file     Comment: pt states she's been smoking since 78yo and has no intentions of stopping  . Alcohol Use: No    OB History    Grav Para Term Preterm Abortions TAB SAB Ect Mult Living                  Review of Systems  Constitutional: Negative.   HENT: Negative.   Respiratory: Negative.   Cardiovascular: Negative.   Gastrointestinal:       As per history of present illness  Genitourinary: Negative.   Neurological: Negative.     Psychiatric/Behavioral: Negative.     Allergies  Review of patient's allergies indicates no known allergies.  Home Medications   Current Outpatient Rx  Name  Route  Sig  Dispense  Refill  . ALENDRONATE SODIUM 70 MG PO TABS   Oral   Take 1 tablet (70 mg total) by mouth every 7 (seven) days. Take with a full glass of water on an empty stomach.   12 tablet   3   . ALENDRONATE SODIUM 70 MG PO TABS      TAKE 1 TABLET EVERY 7 DAYS WITH FULL GLASS WATER ON AN EMPTY STOMACH   12 tablet   1   . ASPIRIN 81 MG PO TABS   Oral   Take 81 mg by mouth daily.           Marland Kitchen CALCIUM CARBONATE-VITAMIN D 600-400 MG-UNIT PO TABS   Oral   Take 1 tablet by mouth daily.           . ENALAPRIL MALEATE 20 MG PO TABS   Oral   Take 1 tablet (20 mg total) by mouth daily.   90 tablet   3   . GLIPIZIDE 5 MG PO TABS   Oral   Take 1 tablet (5 mg total) by mouth 2 (two) times daily before a meal.   180 tablet   3   .  HYDROCHLOROTHIAZIDE 50 MG PO TABS   Oral   Take 1 tablet (50 mg total) by mouth daily.   90 tablet   3   . METOPROLOL TARTRATE 100 MG PO TABS   Oral   Take 1 tablet (100 mg total) by mouth 2 (two) times daily.   180 tablet   3   . OMEPRAZOLE 20 MG PO CPDR   Oral   Take 1 capsule (20 mg total) by mouth daily.   15 capsule   0   . SIMVASTATIN 40 MG PO TABS   Oral   Take 1 tablet (40 mg total) by mouth at bedtime.   90 tablet   3     BP 126/63  Pulse 106  Temp 97 F (36.1 C) (Oral)  Resp 18  SpO2 97%  Physical Exam  Nursing note and vitals reviewed. Constitutional: She is oriented to person, place, and time. She appears well-developed and well-nourished. No distress.  Neck: Normal range of motion. Neck supple.  Cardiovascular: Normal rate, regular rhythm and normal heart sounds.   Pulmonary/Chest: Effort normal. She has no wheezes. She has no rales.       Diminished breath sounds bilaterally. This is at her baseline as she has COPD. Kyphosis  Abdominal:  Soft. She exhibits no distension. There is no tenderness. There is no rebound and no guarding.  Musculoskeletal: Normal range of motion.  Neurological: She is alert and oriented to person, place, and time. She exhibits normal muscle tone.  Skin: Skin is warm and dry.  Psychiatric: She has a normal mood and affect.    ED Course  Procedures (including critical care time)  Labs Reviewed - No data to display No results found.   1. Gastritis       MDM  She is instructed on a diet with bland foods to include more fluids and food. She should hold off on the wheeze, hot dogs, hamburgers, pizza, fried foods, spicy foods until her stomach feels better. She is given a diet to follow. She is asymptomatic tonight.  SHe is discharged in stable condition Omeprazole 20 mg daily as needed for the next 2 weeks. : Your doctor next week as needed recheck promptly for any symptoms problems or worsening        Hayden Rasmussen, NP 04/09/12 2000

## 2012-04-09 NOTE — ED Notes (Signed)
Pt reports vomiting (once yesterday) and diarrhea ( yesterday) since last Monday  - " i just want to be able to eat anything I want - it's taking me a long time to get over this "

## 2012-04-16 NOTE — ED Provider Notes (Signed)
Medical screening examination/treatment/procedure(s) were performed by resident physician or non-physician practitioner and as supervising physician I was immediately available for consultation/collaboration.   Aeden Matranga DOUGLAS MD.    Vala Raffo D Leshawn Straka, MD 04/16/12 1350 

## 2012-04-20 ENCOUNTER — Emergency Department (HOSPITAL_COMMUNITY): Payer: Medicare Other

## 2012-04-20 ENCOUNTER — Encounter (HOSPITAL_COMMUNITY): Payer: Self-pay | Admitting: Nurse Practitioner

## 2012-04-20 ENCOUNTER — Observation Stay (HOSPITAL_COMMUNITY)
Admission: EM | Admit: 2012-04-20 | Discharge: 2012-04-21 | Disposition: A | Discharge status: Home / Self Care | Payer: Medicare Other | Attending: Internal Medicine | Admitting: Internal Medicine

## 2012-04-20 DIAGNOSIS — J449 Chronic obstructive pulmonary disease, unspecified: Secondary | ICD-10-CM

## 2012-04-20 DIAGNOSIS — R630 Anorexia: Secondary | ICD-10-CM | POA: Insufficient documentation

## 2012-04-20 DIAGNOSIS — D649 Anemia, unspecified: Secondary | ICD-10-CM | POA: Insufficient documentation

## 2012-04-20 DIAGNOSIS — J069 Acute upper respiratory infection, unspecified: Principal | ICD-10-CM

## 2012-04-20 DIAGNOSIS — E785 Hyperlipidemia, unspecified: Secondary | ICD-10-CM

## 2012-04-20 DIAGNOSIS — I10 Essential (primary) hypertension: Secondary | ICD-10-CM | POA: Diagnosis present

## 2012-04-20 DIAGNOSIS — R0902 Hypoxemia: Secondary | ICD-10-CM

## 2012-04-20 DIAGNOSIS — J441 Chronic obstructive pulmonary disease with (acute) exacerbation: Secondary | ICD-10-CM

## 2012-04-20 DIAGNOSIS — F17200 Nicotine dependence, unspecified, uncomplicated: Secondary | ICD-10-CM

## 2012-04-20 DIAGNOSIS — E119 Type 2 diabetes mellitus without complications: Secondary | ICD-10-CM | POA: Diagnosis present

## 2012-04-20 DIAGNOSIS — E860 Dehydration: Secondary | ICD-10-CM | POA: Insufficient documentation

## 2012-04-20 DIAGNOSIS — M810 Age-related osteoporosis without current pathological fracture: Secondary | ICD-10-CM

## 2012-04-20 DIAGNOSIS — N179 Acute kidney failure, unspecified: Secondary | ICD-10-CM | POA: Insufficient documentation

## 2012-04-20 DIAGNOSIS — N390 Urinary tract infection, site not specified: Secondary | ICD-10-CM | POA: Insufficient documentation

## 2012-04-20 LAB — URINE MICROSCOPIC-ADD ON

## 2012-04-20 LAB — BASIC METABOLIC PANEL
BUN: 24 mg/dL — ABNORMAL HIGH (ref 6–23)
Calcium: 10.5 mg/dL (ref 8.4–10.5)
GFR calc non Af Amer: 37 mL/min — ABNORMAL LOW (ref >90)
Glucose, Bld: 189 mg/dL — ABNORMAL HIGH (ref 70–99)

## 2012-04-20 LAB — CBC
HCT: 42.8 % (ref 36.0–46.0)
Hemoglobin: 14.3 g/dL (ref 12.0–15.0)
MCH: 29.7 pg (ref 26.0–34.0)
MCHC: 33.4 g/dL (ref 30.0–36.0)
MCV: 88.8 fL (ref 78.0–100.0)

## 2012-04-20 LAB — HEPATIC FUNCTION PANEL
ALT: 9 U/L (ref 0–35)
Albumin: 3.4 g/dL — ABNORMAL LOW (ref 3.5–5.2)
Alkaline Phosphatase: 67 U/L (ref 39–117)
Indirect Bilirubin: 0.3 mg/dL (ref 0.3–0.9)
Total Bilirubin: 0.4 mg/dL (ref 0.3–1.2)

## 2012-04-20 LAB — POCT I-STAT TROPONIN I: Troponin i, poc: 0.03 ng/mL (ref 0.00–0.08)

## 2012-04-20 LAB — CG4 I-STAT (LACTIC ACID): Lactic Acid, Venous: 1.14 mmol/L (ref 0.5–2.2)

## 2012-04-20 LAB — URINALYSIS, ROUTINE W REFLEX MICROSCOPIC
Hgb urine dipstick: NEGATIVE
Protein, ur: NEGATIVE mg/dL
Urobilinogen, UA: 2 mg/dL — ABNORMAL HIGH (ref 0.0–1.0)

## 2012-04-20 LAB — LIPASE, BLOOD: Lipase: 25 U/L (ref 11–59)

## 2012-04-20 LAB — PROCALCITONIN: Procalcitonin: 0.25 ng/mL

## 2012-04-20 MED ORDER — GUAIFENESIN-DM 100-10 MG/5ML PO SYRP: 5.0000 mL | ORAL_SOLUTION | ORAL | Status: DC | PRN

## 2012-04-20 MED ORDER — INSULIN ASPART 100 UNIT/ML ~~LOC~~ SOLN
0.0000 [IU] | Freq: Three times a day (TID) | SUBCUTANEOUS | Status: DC
  Administered 2012-04-21: 3 [IU] via SUBCUTANEOUS
  Administered 2012-04-21: 7 [IU] via SUBCUTANEOUS

## 2012-04-20 MED ORDER — HYDROCHLOROTHIAZIDE 50 MG PO TABS
50.0000 mg | ORAL_TABLET | Freq: Every day | ORAL | Status: DC
  Administered 2012-04-21: 50 mg via ORAL
  Filled 2012-04-20: qty 1

## 2012-04-20 MED ORDER — ALBUTEROL SULFATE (5 MG/ML) 0.5% IN NEBU
5.0000 mg | INHALATION_SOLUTION | Freq: Once | RESPIRATORY_TRACT | Status: AC
  Administered 2012-04-20: 5 mg via RESPIRATORY_TRACT
  Filled 2012-04-20: qty 1

## 2012-04-20 MED ORDER — CALCIUM CARBONATE-VITAMIN D 500-200 MG-UNIT PO TABS
1.0000 | ORAL_TABLET | Freq: Every day | ORAL | Status: DC
  Administered 2012-04-21: 1 via ORAL
  Filled 2012-04-20: qty 1

## 2012-04-20 MED ORDER — AMLODIPINE BESYLATE 5 MG PO TABS
5.0000 mg | ORAL_TABLET | Freq: Every day | ORAL | Status: DC
  Administered 2012-04-21: 5 mg via ORAL
  Filled 2012-04-20: qty 1

## 2012-04-20 MED ORDER — ALBUTEROL SULFATE (5 MG/ML) 0.5% IN NEBU: 2.5000 mg | INHALATION_SOLUTION | RESPIRATORY_TRACT | Status: DC | PRN

## 2012-04-20 MED ORDER — ACETAMINOPHEN 325 MG PO TABS: 650.0000 mg | ORAL_TABLET | Freq: Four times a day (QID) | ORAL | Status: DC | PRN

## 2012-04-20 MED ORDER — SODIUM CHLORIDE 0.9 % IV SOLN: INTRAVENOUS | Status: DC

## 2012-04-20 MED ORDER — NICOTINE 21 MG/24HR TD PT24
21.0000 mg | MEDICATED_PATCH | Freq: Every day | TRANSDERMAL | Status: DC
  Administered 2012-04-21 (×2): 21 mg via TRANSDERMAL
  Filled 2012-04-20 (×2): qty 1

## 2012-04-20 MED ORDER — IPRATROPIUM BROMIDE 0.02 % IN SOLN: 0.5000 mg | RESPIRATORY_TRACT | Status: DC | PRN

## 2012-04-20 MED ORDER — ASPIRIN 81 MG PO TABS: 81.0000 mg | ORAL_TABLET | Freq: Every day | ORAL | Status: DC

## 2012-04-20 MED ORDER — SODIUM CHLORIDE 0.9 % IV SOLN
1000.0000 mL | Freq: Once | INTRAVENOUS | Status: AC
  Administered 2012-04-20: 1000 mL via INTRAVENOUS

## 2012-04-20 MED ORDER — IPRATROPIUM BROMIDE 0.02 % IN SOLN
0.5000 mg | Freq: Once | RESPIRATORY_TRACT | Status: AC
  Administered 2012-04-20: 0.5 mg via RESPIRATORY_TRACT
  Filled 2012-04-20: qty 2.5

## 2012-04-20 MED ORDER — METOPROLOL TARTRATE 100 MG PO TABS
100.0000 mg | ORAL_TABLET | Freq: Two times a day (BID) | ORAL | Status: DC
  Administered 2012-04-21 (×2): 100 mg via ORAL
  Filled 2012-04-20 (×3): qty 1

## 2012-04-20 MED ORDER — PROMETHAZINE HCL 12.5 MG PO TABS: 12.5000 mg | ORAL_TABLET | Freq: Four times a day (QID) | ORAL | Status: DC | PRN

## 2012-04-20 MED ORDER — SODIUM CHLORIDE 0.9 % IJ SOLN
3.0000 mL | Freq: Two times a day (BID) | INTRAMUSCULAR | Status: DC
  Administered 2012-04-20: 3 mL via INTRAVENOUS

## 2012-04-20 MED ORDER — SODIUM CHLORIDE 0.9 % IV SOLN
1000.0000 mL | INTRAVENOUS | Status: DC
  Administered 2012-04-20 – 2012-04-21 (×3): 1000 mL via INTRAVENOUS

## 2012-04-20 MED ORDER — METHYLPREDNISOLONE SODIUM SUCC 125 MG IJ SOLR
80.0000 mg | Freq: Every day | INTRAMUSCULAR | Status: DC
  Administered 2012-04-21: 80 mg via INTRAVENOUS
  Filled 2012-04-20: qty 1.28

## 2012-04-20 MED ORDER — CALCIUM CARBONATE-VITAMIN D 600-400 MG-UNIT PO TABS: 1.0000 | ORAL_TABLET | Freq: Every day | ORAL | Status: DC

## 2012-04-20 MED ORDER — ACETAMINOPHEN 650 MG RE SUPP: 650.0000 mg | Freq: Four times a day (QID) | RECTAL | Status: DC | PRN

## 2012-04-20 MED ORDER — METHYLPREDNISOLONE SODIUM SUCC 125 MG IJ SOLR
125.0000 mg | Freq: Once | INTRAMUSCULAR | Status: AC
  Administered 2012-04-20: 125 mg via INTRAVENOUS
  Filled 2012-04-20: qty 2

## 2012-04-20 MED ORDER — ONDANSETRON HCL 4 MG/2ML IJ SOLN: 4.0000 mg | Freq: Once | INTRAMUSCULAR | Status: DC

## 2012-04-20 MED ORDER — ALENDRONATE SODIUM 70 MG PO TABS: 70.0000 mg | ORAL_TABLET | ORAL | Status: DC

## 2012-04-20 MED ORDER — ATORVASTATIN CALCIUM 20 MG PO TABS
20.0000 mg | ORAL_TABLET | Freq: Every day | ORAL | Status: DC
  Filled 2012-04-20: qty 1

## 2012-04-20 MED ORDER — CEPHALEXIN 250 MG PO CAPS
1000.0000 mg | ORAL_CAPSULE | Freq: Once | ORAL | Status: AC
  Administered 2012-04-20: 1000 mg via ORAL
  Filled 2012-04-20: qty 4

## 2012-04-20 MED ORDER — HEPARIN SODIUM (PORCINE) 5000 UNIT/ML IJ SOLN
5000.0000 [IU] | Freq: Three times a day (TID) | INTRAMUSCULAR | Status: DC
  Administered 2012-04-21 (×2): 5000 [IU] via SUBCUTANEOUS
  Filled 2012-04-20 (×4): qty 1

## 2012-04-20 MED ORDER — ASPIRIN 81 MG PO CHEW
81.0000 mg | CHEWABLE_TABLET | Freq: Every day | ORAL | Status: DC
  Administered 2012-04-21: 81 mg via ORAL
  Filled 2012-04-20: qty 1

## 2012-04-20 MED ORDER — SIMVASTATIN 40 MG PO TABS: 40.0000 mg | ORAL_TABLET | Freq: Every day | ORAL | Status: DC

## 2012-04-20 NOTE — ED Provider Notes (Signed)
History     CSN: [id number]  Arrival date & time 04/20/12  1520   First MD Initiated Contact with Patient 04/20/12 1639      Chief Complaint  Patient presents with  . Anorexia   Primary care is internal medicine teaching service at [facility] (Consider location/radiation/quality/duration/timing/severity/associated sxs/prior treatment) HPI This 79 year old female lives at home alone, she is not on oxygen at home, she is a long-time smoker, she probably has COPD, even though the patient denies this the patient's family said she does have COPD, patient was doing well until the last couple weeks and she has had worse than usual chronic cough, she has shortness of breath, she has intermittent abdominal pain with intermittent nausea intermittent vomiting intermittent diarrhea all of which are nonbloody, she is no sputum production, she is no chest pain, she is no abdominal pain today, she does have anorexia with decreased appetite with dry mouth and feels dehydrated today, she is no dysuria, she is no rash or confusion, she's had no fever documented but she has had chills off and on for the last 2 weeks with generalized weakness. There is no treatment prior to arrival. She has mild shortness of breath today and is hypoxic on room air 86%. Past Medical History  Diagnosis Date  . Hypertension   . Hyperlipidemia   . WOUND, HAND 08/01/2007  . OSTEOPOROSIS 08/14/2006  . HYSTERECTOMY, HX OF 08/14/2006  . Urinary incontinence 09/05/2010  . DIABETES MELLITUS, TYPE II 08/14/2006  . HYPERLIPIDEMIA 08/14/2006  . Hypercalcemia 10/08/2009  . TOBACCO ABUSE 08/14/2006  . HYPERTENSION 08/14/2006  . VENOUS INSUFFICIENCY 08/14/2006   suspected COPD  History reviewed. No pertinent past surgical history.  Family History  Problem Relation Age of Onset  . Cancer Other   . Diabetes Other     History  Substance Use Topics  . Smoking status: Current Every Day Smoker -- 1.0 packs/day    Types: Cigarettes  . Smokeless  tobacco: Not on file     Comment: pt states she's been smoking since 79yo and has no intentions of stopping  . Alcohol Use: No    OB History    Grav Para Term Preterm Abortions TAB SAB Ect Mult Living                  Review of Systems 10 Systems reviewed and are negative for acute change except as noted in the HPI. Allergies  Review of patient's allergies indicates no known allergies.  Home Medications   Current Outpatient Rx  Name  Route  Sig  Dispense  Refill  . ALBUTEROL SULFATE HFA 108 (90 BASE) MCG/ACT IN AERS   Inhalation   Inhale 2 puffs into the lungs every 4 (four) hours as needed for wheezing or shortness of breath.   1 Inhaler   2   . GLIPIZIDE 5 MG PO TABS   Oral   Take 1 tablet (5 mg total) by mouth 2 (two) times daily before a meal.   180 tablet   3   . SULFAMETHOXAZOLE-TRIMETHOPRIM 800-160 MG PO TABS   Oral   Take 1 tablet by mouth 2 (two) times daily.   10 tablet   0     BP 140/88  Pulse 84  Temp 97.9 F (36.6 C) (Rectal)  Resp 20  Ht 5\' 2"  (1.575 m)  Wt 115 lb (52.164 kg)  BMI 21.03 kg/m2  SpO2 100%  Physical Exam  Nursing note and vitals reviewed. Constitutional:  Awake, alert, nontoxic appearance.  HENT:  Head: Atraumatic.       Oropharynx has dry mucosa  Eyes: Right eye exhibits no discharge. Left eye exhibits no discharge.  Neck: Neck supple.  Cardiovascular: Normal rate and regular rhythm.   No murmur heard. Pulmonary/Chest: She is in respiratory distress. She has wheezes. She has no rales. She exhibits no tenderness.       She is able to speak in short sentences with mild respiratory distress at rest with diffuse inspiratory and expiratory wheezes with no crackles no retractions no sensory muscle usage  Abdominal: Soft. Bowel sounds are normal. She exhibits no distension and no mass. There is no tenderness. There is no rebound and no guarding.  Musculoskeletal: She exhibits no edema and no tenderness.       Baseline ROM, no  obvious new focal weakness.  Neurological:       Mental status and motor strength appears baseline for patient and situation.  Skin: No rash noted.  Psychiatric: She has a normal mood and affect.    ED Course  Procedures (including critical care time) ECG: Sinus rhythm, ventricular rate 74, normal axis, no acute ischemic changes noted, no comparison ECG immediately available   Medicine to see patient for admission due to hypoxia. Labs Reviewed  GLUCOSE, CAPILLARY - Abnormal; Notable for the following:    Glucose-Capillary 193 (*)     All other components within normal limits  BASIC METABOLIC PANEL - Abnormal; Notable for the following:    Sodium 130 (*)     Chloride 87 (*)     Glucose, Bld 189 (*)     BUN 24 (*)     Creatinine, Ser 1.32 (*)     GFR calc non Af Amer 37 (*)     GFR calc Af Amer 43 (*)     All other components within normal limits  URINALYSIS, ROUTINE W REFLEX MICROSCOPIC - Abnormal; Notable for the following:    Bilirubin Urine SMALL (*)     Ketones, ur 15 (*)     Urobilinogen, UA 2.0 (*)     Nitrite POSITIVE (*)     All other components within normal limits  HEPATIC FUNCTION PANEL - Abnormal; Notable for the following:    Albumin 3.4 (*)     All other components within normal limits  URINE MICROSCOPIC-ADD ON - Abnormal; Notable for the following:    Squamous Epithelial / LPF FEW (*)     Bacteria, UA MANY (*)     All other components within normal limits  GLUCOSE, CAPILLARY - Abnormal; Notable for the following:    Glucose-Capillary 252 (*)     All other components within normal limits  BASIC METABOLIC PANEL - Abnormal; Notable for the following:    Sodium 134 (*)     Glucose, Bld 262 (*)     BUN 24 (*)     GFR calc non Af Amer 54 (*)     GFR calc Af Amer 63 (*)     All other components within normal limits  CBC - Abnormal; Notable for the following:    Hemoglobin 11.6 (*)  DELTA CHECK NOTED   HCT 35.6 (*)     All other components within normal limits   MAGNESIUM - Abnormal; Notable for the following:    Magnesium 1.3 (*)     All other components within normal limits  INFLUENZA PANEL BY PCR - Abnormal; Notable for the following:    Influenza A  By PCR POSITIVE (*)     H1N1 flu by pcr DETECTED (*)     All other components within normal limits  GLUCOSE, CAPILLARY - Abnormal; Notable for the following:    Glucose-Capillary 221 (*)     All other components within normal limits  GLUCOSE, CAPILLARY - Abnormal; Notable for the following:    Glucose-Capillary 315 (*)     All other components within normal limits  CBC  LIPASE, BLOOD  CULTURE, BLOOD (ROUTINE X 2)  CULTURE, BLOOD (ROUTINE X 2)  PROCALCITONIN  CG4 I-STAT (LACTIC ACID)  POCT I-STAT TROPONIN I  PHOSPHORUS  URINE CULTURE   Dg Chest 2 View  04/20/2012  *RADIOLOGY REPORT*  Clinical Data: Cough and vomiting  CHEST - 2 VIEW  Comparison: None.  Findings: Normal cardiac silhouette.  The lungs hyperinflated. There is chronic bronchitic change centrally and scarring upper lobes.  No focal consolidation.  Nipple shadow are noted.  IMPRESSION:  1. No acute cardiopulmonary process. 2.  Emphysematous change.   Original Report Authenticated By: Genevive Bi, M.D.      1. HYPERTENSION   2. HYPERLIPIDEMIA   3. DIABETES MELLITUS, TYPE II   4. TOBACCO ABUSE   5. Osteoporosis   6. OSTEOPOROSIS   7. Osteoporosis   8. COPD exacerbation   9. Hypoxia   10. URI (upper respiratory infection)   11. Type II or unspecified type diabetes mellitus without mention of complication, not stated as uncontrolled       MDM  The patient appears reasonably stabilized for admission considering the current resources, flow, and capabilities available in the ED at this time, and I doubt any other [facility] requiring further screening and/or treatment in the ED prior to admission.        Hurman Horn, MD 04/21/12 1257

## 2012-04-20 NOTE — H&P (Signed)
Hospital Admission Note Date: 04/20/2012  Patient name: Leah Kaufman Medical record number: [id number] Date of birth: 1929/01/04 Age: 79 y.o. Gender: female PCP: Genella Mech, MD   Weekday Hours (7AM-5PM):  1st Contact:Dr. Ziemer Pager: [phone] 2nd Contact: Dr. Bosie Clos Pager: [phone]  After 5 pm or weekends: 1st Contact: Pager: [phone] 2nd Contact: Pager: [phone]   Chief Complaint: Chills  History of Present Illness:  Ms. Bevis is an 79 year old female with a history of well-controlled type 2 diabetes, hypertension, long term tobacco abuse. She presents with  complaints of chills, some shortness of breath, and runny nose. Patient states that she first became sick right before Christmas with a mild but prolonged course of vomiting and diarrhea. Diarrhea resolved about a week ago, and the vomiting stopped 4 days ago. She has been feeling much better, but she states that hasn't "bounced back" yet. Her daughter came to visit her today and saw that she had chills despite sitting next to the space heater, and decided to bring her into the hospital to be evaluated. The patient also had some shortness of breath which she just noticed today. This occurred while she was sitting, not with activity. She feels it is related to her smoking. When asked, she does endorse a chronic cough which may have worsened recently.  She denies chest pain, fever, dysuria, current nausea or vomiting. No muscle aches. No sick contacts. No leg pain. No recent travel.  Patient was seen at urgent care recently and she thought she had a kidney infection, however, no antibiotics were prescribed. She does not have any dysuria at this time.  She did receive the flu shot in September.  Social history significant for one pack per day x60 years. No alcohol or illicit drugs.   Meds: Current Outpatient Rx  Name  Route  Sig  Dispense  Refill  . ALENDRONATE SODIUM 70 MG PO TABS   Oral   Take 70 mg by mouth every 7  (seven) days. On Sunday; Take with a full glass of water on an empty stomach.         . ASPIRIN 81 MG PO TABS   Oral   Take 81 mg by mouth daily.           Marland Kitchen CALCIUM CARBONATE-VITAMIN D 600-400 MG-UNIT PO TABS   Oral   Take 1 tablet by mouth daily.           . ENALAPRIL MALEATE 20 MG PO TABS   Oral   Take 1 tablet (20 mg total) by mouth daily.   90 tablet   3   . GLIPIZIDE 5 MG PO TABS   Oral   Take 1 tablet (5 mg total) by mouth 2 (two) times daily before a meal.   180 tablet   3   . HYDROCHLOROTHIAZIDE 50 MG PO TABS   Oral   Take 1 tablet (50 mg total) by mouth daily.   90 tablet   3   . METOPROLOL TARTRATE 100 MG PO TABS   Oral   Take 1 tablet (100 mg total) by mouth 2 (two) times daily.   180 tablet   3   . SIMVASTATIN 40 MG PO TABS   Oral   Take 1 tablet (40 mg total) by mouth at bedtime.   90 tablet   3     Allergies: Allergies as of 04/20/2012  . (No Known Allergies)   Past Medical History  Diagnosis Date  . Hypertension   .  Hyperlipidemia   . WOUND, HAND 08/01/2007  . OSTEOPOROSIS 08/14/2006  . HYSTERECTOMY, HX OF 08/14/2006  . Urinary incontinence 09/05/2010  . DIABETES MELLITUS, TYPE II 08/14/2006  . HYPERLIPIDEMIA 08/14/2006  . Hypercalcemia 10/08/2009  . TOBACCO ABUSE 08/14/2006  . HYPERTENSION 08/14/2006  . VENOUS INSUFFICIENCY 08/14/2006   History reviewed. No pertinent past surgical history. Family History  Problem Relation Age of Onset  . Cancer Other   . Diabetes Other    History   Social History  . Marital Status: Widowed    Spouse Name: N/A    Number of Children: N/A  . Years of Education: N/A   Occupational History  . Not on file.   Social History Main Topics  . Smoking status: Current Every Day Smoker -- 1.0 packs/day    Types: Cigarettes  . Smokeless tobacco: Not on file     Comment: pt states she's been smoking since 79yo and has no intentions of stopping  . Alcohol Use: No  . Drug Use: No  . Sexually Active: No   Other  Topics Concern  . Not on file   Social History Narrative  . No narrative on file    Review of Systems: Review of systems negative except as stated in history of present illness  Physical Exam Blood pressure 136/90, pulse 83, temperature 100.2 F (37.9 C), temperature source Rectal, resp. rate 19, SpO2 98.00%. General:  No acute distress, thin, alert and oriented x 3, frail-appearing  HEENT:  PERRL, EOMI, slightly dry mucous membranes, oropharynx clear, no pharyngeal erythema or exudate Cardiovascular:  Regular rate and rhythm, no murmurs Respiratory:  Wheezes throughout, decreased breath sounds at bases Abdomen:  Soft, thin, nondistended, nontender, bowel sounds present Extremities:  Warm and well-perfused, no edema. No calf tenderness. Skin: Warm, dry, no rashes Neuro: Not anxious appearing, no depressed mood, normal affect  Lab results: Basic Metabolic Panel:  Basename 04/20/12 1610  NA 130*  K 3.5  CL 87*  CO2 28  GLUCOSE 189*  BUN 24*  CREATININE 1.32*  CALCIUM 10.5  MG --  PHOS --   Liver Function Tests:  Basename 04/20/12 1610  AST 20  ALT 9  ALKPHOS 67  BILITOT 0.4  PROT 7.2  ALBUMIN 3.4*    Basename 04/20/12 1610  LIPASE 25  AMYLASE --   No results found for this basename: AMMONIA:2 in the last 72 hours CBC:  Basename 04/20/12 1610  WBC 7.9  NEUTROABS --  HGB 14.3  HCT 42.8  MCV 88.8  PLT 229   CBG:  Basename 04/20/12 1530  GLUCAP 193*   Urinalysis:  Basename 04/20/12 1726  COLORURINE YELLOW  LABSPEC 1.020  PHURINE 6.0  GLUCOSEU NEGATIVE  HGBUR NEGATIVE  BILIRUBINUR SMALL*  KETONESUR 15*  PROTEINUR NEGATIVE  UROBILINOGEN 2.0*  NITRITE POSITIVE*  LEUKOCYTESUR NEGATIVE     Imaging results:  Dg Chest 2 View  04/20/2012  *RADIOLOGY REPORT*  Clinical Data: Cough and vomiting  CHEST - 2 VIEW  Comparison: None.  Findings: Normal cardiac silhouette.  The lungs hyperinflated. There is chronic bronchitic change centrally and  scarring upper lobes.  No focal consolidation.  Nipple shadow are noted.  IMPRESSION:  1. No acute cardiopulmonary process. 2.  Emphysematous change.   Original Report Authenticated By: Genevive Bi, M.D.     Other results: EKG: Sinus rhythm, regular, normal intervals, normal R wave progression, there are no ST or T wave changes to suggest ischemia  Assessment & Plan by Problem:  #.  SOB, Chills and runny nose: Her symptoms are likely caused by upper respiratory viral infection. Another possibility is COPD exacerbation, which may have been precipitated by a viral infection. The patient does not have a diagnosis of COPD in the past, however the patient has been smoking heavily, one pack a day for more than 60 years. It is likely that the patient has a baseline COPD, which has been worsened by recent upper respiratory viral infection. Other differential diagnosis includes, but less likely PE (patient does not have chest pain, no tachycardia, oxygen saturation is 98% on 2 L of oxygen, Well's score is low possibility) and PNA (no chest pain and leukocytosis, chest x-ray as no infiltration). Patient's history is not consistent with the flu as her symptoms have been going on for several weeks and furthermore are improving. She also is not complaining of muscle aches.  Plan: - Will be admitted to telemetry bed. - Nebulizers: Albuterol when necessary and Atrovent q4h - Solumedrol daily - consider formal PFTs as an outpatient  - Robitussin for cough when necessary - Patient's symptoms are mild, it is not necessary to check ABG and treat with antibiotics currently  #: HTN:  Patient's blood pressure is 144/97 mmHg on admission. She has been taking metoprolol 100 mg twice a day and Enalapril 20 mg daily at home.   -Will hold Enalapril given elevated creatinine  -will continue Metoprolol and HCTZ, and start Amlodipine 5 mg daily  #:  DM-II: Patient's diabetes is well controlled. Recent A1c was 6.7 at  11/13/11.  -Will discontinue glipizide and start sliding scale insulin -will check A1c  #: HLD: Patient's recent LDL was 78 on 11/13/11. Patient is currently taking Zocor 40 mg daily. She has not noticed any side effects, such as muscle aches. Her AST and ALT were normal on admission. We'll continue current regimen.   #: Elevated creatinine: Cr level is 1.3 on admission,  which is slightly elevated. It is most likely prerenal given her recent history of decreased oral intake and nausea and vomiting.  -Will give IVF  120 ml/h -Follow up BMP   #: Patient reports smoking one pack of cigarettes for more than 60 years and is not currently interested in quitting  -Will encourage cessation of smoking. -will use Nicotine patch while in the hospital -will consult SW  DVT PPX: heparin SQ    Dispo: Disposition is deferred at this time, awaiting improvement of current medical problems. Anticipated discharge in approximately 1 day(s).   The patient does have a current PCP Dorise Hiss, ELIZABETH, MD), therefore will be requiring [facility] follow-up after discharge.   The patient does not have transportation limitations that hinder transportation to clinic appointments.  Signed: Denton Ar 04/20/2012, 7:29 PM

## 2012-04-20 NOTE — ED Notes (Signed)
Daughter states pt has not been eating and has not felt well since christmas. Pt went to [facility] for same and given no dx. Reports symptoms persists. States her stomach hurts sometimes, she doesn't want to eat, she feels ill and weak. A&Ox4, breathing easily

## 2012-04-20 NOTE — ED Notes (Signed)
Patient transported to X-ray. RT notified of need for neb & ordered wheeze protocol

## 2012-04-20 NOTE — ED Notes (Signed)
C/o intermittent lower abd pain, n/v/d, poor appetite, generalized weakness, non prod cough, chills x 2 weeks. Denies pain presently. Reports last emesis 1 week ago , last diarrhea yesterday. Presently denies SOB. States eating makes her feel sick

## 2012-04-21 DIAGNOSIS — J449 Chronic obstructive pulmonary disease, unspecified: Secondary | ICD-10-CM

## 2012-04-21 DIAGNOSIS — I10 Essential (primary) hypertension: Secondary | ICD-10-CM

## 2012-04-21 LAB — BASIC METABOLIC PANEL
BUN: 24 mg/dL — ABNORMAL HIGH (ref 6–23)
Calcium: 8.4 mg/dL (ref 8.4–10.5)
Creatinine, Ser: 0.96 mg/dL (ref 0.50–1.10)
GFR calc non Af Amer: 54 mL/min — ABNORMAL LOW (ref >90)
Glucose, Bld: 262 mg/dL — ABNORMAL HIGH (ref 70–99)
Potassium: 3.8 mEq/L (ref 3.5–5.1)

## 2012-04-21 LAB — PHOSPHORUS: Phosphorus: 2.9 mg/dL (ref 2.3–4.6)

## 2012-04-21 LAB — CBC
Hemoglobin: 11.6 g/dL — ABNORMAL LOW (ref 12.0–15.0)
MCH: 29.3 pg (ref 26.0–34.0)
MCHC: 32.6 g/dL (ref 30.0–36.0)
RDW: 13.3 % (ref 11.5–15.5)

## 2012-04-21 LAB — INFLUENZA PANEL BY PCR (TYPE A & B): H1N1 flu by pcr: DETECTED — AB

## 2012-04-21 MED ORDER — SULFAMETHOXAZOLE-TRIMETHOPRIM 800-160 MG PO TABS: 1.0000 | ORAL_TABLET | Freq: Two times a day (BID) | ORAL | Status: DC

## 2012-04-21 MED ORDER — ALBUTEROL SULFATE HFA 108 (90 BASE) MCG/ACT IN AERS: 2.0000 | INHALATION_SPRAY | RESPIRATORY_TRACT | Status: DC | PRN

## 2012-04-21 MED ORDER — GLIPIZIDE 5 MG PO TABS: 5.0000 mg | ORAL_TABLET | Freq: Two times a day (BID) | ORAL | Status: DC

## 2012-04-21 NOTE — H&P (Signed)
Internal Medicine Teaching Service Attending Note Date: 04/21/2012  Patient name: Leah Kaufman  Medical record number: [id number]  Date of birth: [birth year]    This patient has been seen and discussed with the house staff. Please see their note for complete details. I concur with their findings with the following additions/corrections: Patient is an 79 year old female with past medical history most significant for well-controlled type 2 diabetes, hypertension and ongoing tobacco abuse who was admitted with chief complaints of shortness of breath. Patient was apparently doing well until 2 days ago when she started noticing increased shortness of breath. Patient has a chronic cough which she believes is related to her smoking but has never had shortness of breath. Patient also complained of generalized weakness and decreased appetite which made her go to the urgent care Center. Patient was advised to be admitted by the urgent care Center for overnight observation. She denies chest pain, fever, dysuria, current nausea or vomiting. No muscle aches. No sick contacts. No leg pain. No recent travel.  Patient is very tangential while history taking and it seems that she is doing extremely well since getting admitted yesterday. Patient repeatedly told me that she has been able to eat 3 meals since being in the hospital and feels great.  10 point Review of system is negative except noted in the history of present illness.  BP 140/88  Pulse 84  Temp 97.9 F (36.6 C) (Rectal)  Resp 20  Ht 5\' 2"  (1.575 m)  Wt 115 lb (52.164 kg)  BMI 21.03 kg/m2  SpO2 100% Physical Exam: General: Vital signs reviewed and noted. Well-developed, well-nourished, in no acute distress; alert, appropriate and cooperative throughout examination.  Head: Normocephalic, atraumatic.  Eyes: PERRL, EOMI, No signs of anemia or jaundince.  Nose: Mucous membranes moist, not inflammed, nonerythematous.  Throat: Oropharynx  nonerythematous, no exudate appreciated.   Neck: No deformities, masses, or tenderness noted.Supple, No carotid Bruits, no JVD.  Lungs:  Normal respiratory effort. Clear to auscultation BL without crackles or wheezes.  Heart: RRR. S1 and S2 normal without gallop, murmur, or rubs.  Abdomen:  BS normoactive. Soft, Nondistended, non-tender.  No masses or organomegaly.  Extremities: No pretibial edema.  Neurologic: A&O X3, CN II - XII are grossly intact. Motor strength is 5/5 in the all 4 extremities, Sensations intact to light touch, Cerebellar signs negative.  Skin: No visible rashes, scars.   Labs and imaging studies were reviewed.  79 year old chronic smoker who was admitted with increased shortness of breath following possible URI infection. Patient was started on Solu-Medrol, inhalers and has shown remarkable improvement. No antibiotics were started at this time as her chest x-ray and labs did not reveal infiltrate or leukocytosis. I believe that this would qualify as a mild COPD exacerbation and patient can be discharged home on prednisone taper with followup with her primary care physician next week.  Patient was counseled for smoking cessation.  Rest of the medical management as per resident's note.   Lars Mage 04/21/2012, 7:53 PM

## 2012-04-21 NOTE — Progress Notes (Signed)
Subjective:    Patient feeling much better this AM, and eating breakfast. No SOB. States she is ready to go home.  Interval Events: No acute events.    Objective:    Vital Signs:   Temp:  [97.9 F (36.6 C)-100.2 F (37.9 C)] 97.9 F (36.6 C) (01/11 2315) Pulse Rate:  [70-84] 84  (01/11 2315) Resp:  [16-31] 20  (01/11 2315) BP: (130-151)/(49-126) 140/88 mmHg (01/11 2315) SpO2:  [85 %-100 %] 100 % (01/12 0500) Weight:  [114 lb (51.71 kg)-115 lb (52.164 kg)] 115 lb (52.164 kg) (01/12 0453) Last BM Date: 04/19/12  24-hour weight change: Weight change:   Intake/Output:   Intake/Output Summary (Last 24 hours) at 04/21/12 1030 Last data filed at 04/21/12 0500  Gross per 24 hour  Intake   1240 ml  Output      0 ml  Net   1240 ml      Physical Exam: General: Vital signs reviewed and noted. Well-developed, well-nourished, in no acute distress; alert, appropriate and cooperative throughout examination.  Lungs:  Normal respiratory effort. Clear to auscultation BL without crackles or wheezes.  Heart: RRR. S1 and S2 normal without gallop, murmur, or rubs.  Abdomen:  BS normoactive. Soft, Nondistended, non-tender.  No masses or organomegaly.  Extremities: No pretibial edema.     Labs:  Basic Metabolic Panel:  Lab 04/21/12 1610 04/20/12 1610  NA 134* 130*  K 3.8 3.5  CL 98 87*  CO2 24 28  GLUCOSE 262* 189*  BUN 24* 24*  CREATININE 0.96 1.32*  CALCIUM 8.4 10.5  MG 1.3* --  PHOS 2.9 --    Liver Function Tests:  Lab 04/20/12 1610  AST 20  ALT 9  ALKPHOS 67  BILITOT 0.4  PROT 7.2  ALBUMIN 3.4*    Lab 04/20/12 1610  LIPASE 25  AMYLASE --   CBC:  Lab 04/21/12 0545 04/20/12 1610  WBC 4.2 7.9  NEUTROABS -- --  HGB 11.6* 14.3  HCT 35.6* 42.8  MCV 89.9 88.8  PLT 191 229   CBG:  Lab 04/21/12 0722 04/20/12 2309 04/20/12 1530  GLUCAP 221* 252* 193*   Imaging: Dg Chest 2 View  04/20/2012  *RADIOLOGY REPORT*  Clinical Data: Cough and vomiting  CHEST -  2 VIEW  Comparison: None.  Findings: Normal cardiac silhouette.  The lungs hyperinflated. There is chronic bronchitic change centrally and scarring upper lobes.  No focal consolidation.  Nipple shadow are noted.  IMPRESSION:  1. No acute cardiopulmonary process. 2.  Emphysematous change.   Original Report Authenticated By: Genevive Bi, M.D.        Medications:    Infusions:    . sodium chloride 1,000 mL (04/21/12 0500)  . sodium chloride 125 mL/hr at 04/20/12 2345    Scheduled Medications:    . amLODipine  5 mg Oral Daily  . aspirin  81 mg Oral Daily  . atorvastatin  20 mg Oral q1800  . calcium-vitamin D  1 tablet Oral Daily  . heparin  5,000 Units Subcutaneous Q8H  . hydrochlorothiazide  50 mg Oral Daily  . insulin aspart  0-9 Units Subcutaneous TID WC  . methylPREDNISolone (SOLU-MEDROL) injection  80 mg Intravenous Daily  . metoprolol  100 mg Oral BID  . nicotine  21 mg Transdermal Daily  . ondansetron (ZOFRAN) IV  4 mg Intravenous Once  . sodium chloride  3 mL Intravenous Q12H    PRN Medications: acetaminophen, acetaminophen, albuterol, guaiFENesin-dextromethorphan, ipratropium, promethazine   Assessment/  Plan:   Acute kidney injury - resolved after IVF rehydration (Cr ~0.9).   UTI - urine + for nitrites. Pt will be started on bactrim at discharge (today).    Upper respiratory infection - resolved. No complaints this AM.   Anemia - Hb = 11.6 after admission. Likely dilutional.  Hypertension - stable, with slightly elevated BPs in the ED, likely 2/2 solumedrol. Enalapril being held.   Type 2 diabetes mellitus - CBGs elevated after receiving solumedrol in the ED. Will discharge on home glipizide.   Hyperlipidemia - No changes in home meds.    DVT PPX - heparin  CODE STATUS - full  CONSULTS PLACED - N/A  DISPO - Patient's acute issues have resolved, and she will be discharged home with an albuterol inhaler for likely COPD, and on bactrim for her UTI  (please see d/c summary for further details).   The patient does have a current [facility] PCP (Dorise Hiss, Lanora Manis, MD) and does need an [facility] hospital follow-up appointment after discharge.    Is the [facility] hospital follow-up appointment a one-time only appointment? not applicable.  Does the patient have transportation limitations that hinder transportation to clinic appointments? no   SERVICE NEEDED AT DISCHARGE - TO BE DETERMINED DURING HOSPITAL COURSE         Y = Yes, Blank = No PT:   OT:   RN:   Equipment:   Other:      Length of Stay: 1 day(s)   Signed: Elfredia Nevins, MD  PGY-1, Internal Medicine Resident Pager: [phone] (7AM-5PM) 04/21/2012, 10:30 AM

## 2012-04-21 NOTE — Discharge Summary (Signed)
Patient Name:  Leah Kaufman MRN: [id number]  PCP: Judie Bonus, MD DOB:  18-Mar-1929       Date of Admission:  04/20/2012  Date of Discharge:  04/21/2012      Attending Physician: Dr. Lars Mage, MD         DISCHARGE DIAGNOSES: Acute kidney injury UTI Upper respiratory infection Hypertension Type 2 diabetes mellitus Hyperlipidemia   DISPOSITION AND FOLLOW-UP: SEMIRA STOLTZFUS is to follow-up with the listed providers as detailed below, at which time, the following should be addressed:   1. F/u symptoms of SOB/cough. Consider PFTs as pt has a long smoking history and bronchitic/emphysematous changes on CXR consistent with COPD. If symptoms recur, consider treating with antibiotics.  2. F/u on AKI by rechecking Cr 3. F/u on UTI symptoms 4. F/u mild anemia by rechecking CBC 5. Labs / imaging needed: BMET, CBC, UA (if indicated) 6. Pending labs/ test needing follow-up: urine culture  Follow-up Information    Follow up with [facility]. (You will be contacted with a time/date for an appointment. )    Contact information:   [address] [place] [address]             Discharge Orders    Future Orders Please Complete By Expires   Diet - low sodium heart healthy      Increase activity slowly          DISCHARGE MEDICATIONS:   Medication List     As of 04/21/2012 10:37 AM    STOP taking these medications         enalapril 20 MG tablet   Commonly known as: VASOTEC      glipiZIDE 5 MG tablet   Commonly known as: GLUCOTROL      TAKE these medications         albuterol 108 (90 BASE) MCG/ACT inhaler   Commonly known as: PROVENTIL HFA;VENTOLIN HFA   Inhale 2 puffs into the lungs every 4 (four) hours as needed for wheezing or shortness of breath.      alendronate 70 MG tablet   Commonly known as: FOSAMAX   Take 70 mg by mouth every 7 (seven) days. On Sunday; Take with a full glass of water on an empty stomach.      aspirin 81 MG  tablet   Take 81 mg by mouth daily.      Calcium 600+D 600-400 MG-UNIT per tablet   Generic drug: Calcium Carbonate-Vitamin D   Take 1 tablet by mouth daily.      hydrochlorothiazide 50 MG tablet   Commonly known as: HYDRODIURIL   Take 1 tablet (50 mg total) by mouth daily.      metoprolol 100 MG tablet   Commonly known as: LOPRESSOR   Take 1 tablet (100 mg total) by mouth 2 (two) times daily.      simvastatin 40 MG tablet   Commonly known as: ZOCOR   Take 1 tablet (40 mg total) by mouth at bedtime.      sulfamethoxazole-trimethoprim 800-160 MG per tablet   Commonly known as: BACTRIM DS,SEPTRA DS   Take 1 tablet by mouth 2 (two) times daily.         PROCEDURES PERFORMED:  Dg Chest 2 View  04/20/2012  *RADIOLOGY REPORT*  Clinical Data: Cough and vomiting  CHEST - 2 VIEW  Comparison: None.  Findings: Normal cardiac silhouette.  The lungs hyperinflated. There is chronic bronchitic change centrally  and scarring upper lobes.  No focal consolidation.  Nipple shadow are noted.  IMPRESSION:  1. No acute cardiopulmonary process. 2.  Emphysematous change.   Original Report Authenticated By: Genevive Bi, M.D.        ADMISSION DATA: H&P: Leah Kaufman is an 79 year old female with a history of well-controlled type 2 diabetes, hypertension, long term tobacco abuse. She presents with complaints of chills, some shortness of breath, and runny nose. Patient states that she first became sick right before Christmas with a mild but prolonged course of vomiting and diarrhea. Diarrhea resolved about a week ago, and the vomiting stopped 4 days ago. She has been feeling much better, but she states that hasn't "bounced back" yet. Her daughter came to visit her today and saw that she had chills despite sitting next to the space heater, and decided to bring her into the hospital to be evaluated. The patient also had some shortness of breath which she just noticed today. This occurred while she was sitting,  not with activity. She feels it is related to her smoking. When asked, she does endorse a chronic cough which may have worsened recently.  She denies chest pain, fever, dysuria, current nausea or vomiting. No muscle aches. No sick contacts. No leg pain. No recent travel.  Patient was seen at urgent care recently and she thought she had a kidney infection, however, no antibiotics were prescribed. She does not have any dysuria at this time.  She did receive the flu shot in September.  Social history significant for one pack per day x60 years. No alcohol or illicit drugs.   Physical Exam: Blood pressure 136/90, pulse 83, temperature 100.2 F (37.9 C), temperature source Rectal, resp. rate 19, SpO2 98.00%.  General: No acute distress, thin, alert and oriented x 3, frail-appearing  HEENT: PERRL, EOMI, slightly dry mucous membranes, oropharynx clear, no pharyngeal erythema or exudate  Cardiovascular: Regular rate and rhythm, no murmurs  Respiratory: Wheezes throughout, decreased breath sounds at bases  Abdomen: Soft, thin, nondistended, nontender, bowel sounds present  Extremities: Warm and well-perfused, no edema. No calf tenderness.  Skin: Warm, dry, no rashes  Neuro: Not anxious appearing, no depressed mood, normal affect  Labs: Basic Metabolic Panel:   Basename  04/20/12 1610   NA  130*   K  3.5   CL  87*   CO2  28   GLUCOSE  189*   BUN  24*   CREATININE  1.32*   CALCIUM  10.5   MG  --   PHOS  --    Liver Function Tests:   Basename  04/20/12 1610   AST  20   ALT  9   ALKPHOS  67   BILITOT  0.4   PROT  7.2   ALBUMIN  3.4*     Basename  04/20/12 1610   LIPASE  25   AMYLASE  --    CBC:   Basename  04/20/12 1610   WBC  7.9   NEUTROABS  --   HGB  14.3   HCT  42.8   MCV  88.8   PLT  229    CBG:   Basename  04/20/12 1530   GLUCAP  193*    Urinalysis:   Basename  04/20/12 1726   COLORURINE  YELLOW   LABSPEC  1.020   PHURINE  6.0   GLUCOSEU  NEGATIVE     HGBUR  NEGATIVE   BILIRUBINUR  SMALL*   KETONESUR  15*  PROTEINUR  NEGATIVE   UROBILINOGEN  2.0*   NITRITE  POSITIVE*   LEUKOCYTESUR  NEGATIVE      HOSPITAL COURSE: Acute kidney injury - Cr elevated at ~1.3 on admission. Subsequently improved to baseline ~0.9 after IV rehydration. Pt likely had prerenal azotemia 2/2 decreased PO intake. Pt states that her appetite had returned on the morning after admission, so this issue should not recur unless patient redevelops the anorexic symptoms with which she presented.   UTI - patient complains of recent nausea and vomiting. These complaints may be 2/2 a recent viral infection (per below), but might also have been caused by a UTI, as her urine is nitrite positive. Urine cultures were ordered and were pending at time of discharge. Pt was discharged with a 5 day course of bactrim. At f/u, ongoing symptoms and compliance with these abx should be assessed.   Upper respiratory infection - pt's coryza symptoms and nausea/vomiting most consistent with URI. No complaints of SOB after admission. Pt likely has underlying COPD per her ~60 pack year smoking history and CXR findings (bronchitic/emphysematous changes). Pt had wheezes on admission as well, and was discharged with an albuterol inhaler. No convincing evidence of actual COPD exacerbation, and thus pt was not prescribed steroids or antibiotics for this issue. She will need PFTs scheduled at hospital f/u.   Anemia - Hb = 11.6 after admission. Likely dilutional. May require further investigation at f/u if Hb remains low at that time.   Hypertension - stable, with slightly elevated BPs in the ED, likely 2/2 solumedrol. Pt's enalapril was held on admission and at discharge in setting of AKI, and should be restarted at f/u if pt's acute illness is resolved.   Type 2 diabetes mellitus - stable. No change in home meds at discharge.   Hyperlipidemia - No changes in home meds.     DISCHARGE DATA: Vital  Signs: BP 140/88  Pulse 84  Temp 97.9 F (36.6 C) (Rectal)  Resp 20  Ht 5\' 2"  (1.575 m)  Wt 115 lb (52.164 kg)  BMI 21.03 kg/m2  SpO2 100%  Labs: Results for orders placed during the hospital encounter of 04/20/12 (from the past 24 hour(s))  GLUCOSE, CAPILLARY     Status: Abnormal   Collection Time   04/20/12  3:30 PM      Component Value Range   Glucose-Capillary 193 (*) 70 - 99 mg/dL  CBC     Status: Normal   Collection Time   04/20/12  4:10 PM      Component Value Range   WBC 7.9  4.0 - 10.5 K/uL   RBC 4.82  3.87 - 5.11 MIL/uL   Hemoglobin 14.3  12.0 - 15.0 g/dL   HCT 16.1  09.6 - 04.5 %   MCV 88.8  78.0 - 100.0 fL   MCH 29.7  26.0 - 34.0 pg   MCHC 33.4  30.0 - 36.0 g/dL   RDW 40.9  81.1 - 91.4 %   Platelets 229  150 - 400 K/uL  BASIC METABOLIC PANEL     Status: Abnormal   Collection Time   04/20/12  4:10 PM      Component Value Range   Sodium 130 (*) 135 - 145 mEq/L   Potassium 3.5  3.5 - 5.1 mEq/L   Chloride 87 (*) 96 - 112 mEq/L   CO2 28  19 - 32 mEq/L   Glucose, Bld 189 (*) 70 - 99 mg/dL   BUN 24 (*) 6 -  23 mg/dL   Creatinine, Ser 1.61 (*) 0.50 - 1.10 mg/dL   Calcium 09.6  8.4 - 04.5 mg/dL   GFR calc non Af Amer 37 (*) >90 mL/min   GFR calc Af Amer 43 (*) >90 mL/min  HEPATIC FUNCTION PANEL     Status: Abnormal   Collection Time   04/20/12  4:10 PM      Component Value Range   Total Protein 7.2  6.0 - 8.3 g/dL   Albumin 3.4 (*) 3.5 - 5.2 g/dL   AST 20  0 - 37 U/L   ALT 9  0 - 35 U/L   Alkaline Phosphatase 67  39 - 117 U/L   Total Bilirubin 0.4  0.3 - 1.2 mg/dL   Bilirubin, Direct 0.1  0.0 - 0.3 mg/dL   Indirect Bilirubin 0.3  0.3 - 0.9 mg/dL  LIPASE, BLOOD     Status: Normal   Collection Time   04/20/12  4:10 PM      Component Value Range   Lipase 25  11 - 59 U/L  PROCALCITONIN     Status: Normal   Collection Time   04/20/12  4:54 PM      Component Value Range   Procalcitonin 0.25    POCT I-STAT TROPONIN I     Status: Normal   Collection Time    04/20/12  5:12 PM      Component Value Range   Troponin i, poc 0.03  0.00 - 0.08 ng/mL   Comment 3           CG4 I-STAT (LACTIC ACID)     Status: Normal   Collection Time   04/20/12  5:15 PM      Component Value Range   Lactic Acid, Venous 1.14  0.5 - 2.2 mmol/L  URINALYSIS, ROUTINE W REFLEX MICROSCOPIC     Status: Abnormal   Collection Time   04/20/12  5:26 PM      Component Value Range   Color, Urine YELLOW  YELLOW   APPearance CLEAR  CLEAR   Specific Gravity, Urine 1.020  1.005 - 1.030   pH 6.0  5.0 - 8.0   Glucose, UA NEGATIVE  NEGATIVE mg/dL   Hgb urine dipstick NEGATIVE  NEGATIVE   Bilirubin Urine SMALL (*) NEGATIVE   Ketones, ur 15 (*) NEGATIVE mg/dL   Protein, ur NEGATIVE  NEGATIVE mg/dL   Urobilinogen, UA 2.0 (*) 0.0 - 1.0 mg/dL   Nitrite POSITIVE (*) NEGATIVE   Leukocytes, UA NEGATIVE  NEGATIVE  URINE MICROSCOPIC-ADD ON     Status: Abnormal   Collection Time   04/20/12  5:26 PM      Component Value Range   Squamous Epithelial / LPF FEW (*) RARE   WBC, UA 0-2  <3 WBC/hpf   Bacteria, UA MANY (*) RARE  GLUCOSE, CAPILLARY     Status: Abnormal   Collection Time   04/20/12 11:09 PM      Component Value Range   Glucose-Capillary 252 (*) 70 - 99 mg/dL  BASIC METABOLIC PANEL     Status: Abnormal   Collection Time   04/21/12  5:45 AM      Component Value Range   Sodium 134 (*) 135 - 145 mEq/L   Potassium 3.8  3.5 - 5.1 mEq/L   Chloride 98  96 - 112 mEq/L   CO2 24  19 - 32 mEq/L   Glucose, Bld 262 (*) 70 - 99 mg/dL   BUN 24 (*) 6 -  23 mg/dL   Creatinine, Ser 1.61  0.50 - 1.10 mg/dL   Calcium 8.4  8.4 - 09.6 mg/dL   GFR calc non Af Amer 54 (*) >90 mL/min   GFR calc Af Amer 63 (*) >90 mL/min  CBC     Status: Abnormal   Collection Time   04/21/12  5:45 AM      Component Value Range   WBC 4.2  4.0 - 10.5 K/uL   RBC 3.96  3.87 - 5.11 MIL/uL   Hemoglobin 11.6 (*) 12.0 - 15.0 g/dL   HCT 04.5 (*) 40.9 - 81.1 %   MCV 89.9  78.0 - 100.0 fL   MCH 29.3  26.0 - 34.0 pg   MCHC  32.6  30.0 - 36.0 g/dL   RDW 91.4  78.2 - 95.6 %   Platelets 191  150 - 400 K/uL  PHOSPHORUS     Status: Normal   Collection Time   04/21/12  5:45 AM      Component Value Range   Phosphorus 2.9  2.3 - 4.6 mg/dL  MAGNESIUM     Status: Abnormal   Collection Time   04/21/12  5:45 AM      Component Value Range   Magnesium 1.3 (*) 1.5 - 2.5 mg/dL  GLUCOSE, CAPILLARY     Status: Abnormal   Collection Time   04/21/12  7:22 AM      Component Value Range   Glucose-Capillary 221 (*) 70 - 99 mg/dL     Time spent on discharge: 40 minutes  Services Ordered on Discharge: Y = Yes; Blank = No PT:   OT:   RN:   Equipment:   Other:     Signed: Elfredia Nevins, MD   PGY 1, Internal Medicine Resident 04/21/2012, 10:37 AM

## 2012-04-21 NOTE — Progress Notes (Signed)
Patient O2 saturation 94% RA at rest;  After amb. O2 Saturation = 96%. No SOB .

## 2012-04-21 NOTE — Progress Notes (Signed)
Utilization review completed.  

## 2012-04-22 LAB — URINE CULTURE: Colony Count: >=100000

## 2012-04-26 LAB — CULTURE, BLOOD (ROUTINE X 2): Culture: NO GROWTH

## 2012-05-03 ENCOUNTER — Encounter: Payer: BC Managed Care – PPO | Admitting: Internal Medicine

## 2012-05-10 ENCOUNTER — Ambulatory Visit (INDEPENDENT_AMBULATORY_CARE_PROVIDER_SITE_OTHER): Payer: Medicare Other | Admitting: Internal Medicine

## 2012-05-10 ENCOUNTER — Encounter: Payer: Self-pay | Admitting: Internal Medicine

## 2012-05-10 VITALS — BP 122/68 | HR 79 | Temp 97.1°F | Wt 99.0 lb

## 2012-05-10 DIAGNOSIS — N390 Urinary tract infection, site not specified: Secondary | ICD-10-CM

## 2012-05-10 DIAGNOSIS — E785 Hyperlipidemia, unspecified: Secondary | ICD-10-CM

## 2012-05-10 DIAGNOSIS — Z9109 Other allergy status, other than to drugs and biological substances: Secondary | ICD-10-CM

## 2012-05-10 DIAGNOSIS — E119 Type 2 diabetes mellitus without complications: Secondary | ICD-10-CM

## 2012-05-10 DIAGNOSIS — F17200 Nicotine dependence, unspecified, uncomplicated: Secondary | ICD-10-CM

## 2012-05-10 DIAGNOSIS — Z889 Allergy status to unspecified drugs, medicaments and biological substances status: Secondary | ICD-10-CM

## 2012-05-10 DIAGNOSIS — I10 Essential (primary) hypertension: Secondary | ICD-10-CM

## 2012-05-10 LAB — CBC
HCT: 37.3 % (ref 36.0–46.0)
Hemoglobin: 12.3 g/dL (ref 12.0–15.0)
MCH: 28.9 pg (ref 26.0–34.0)
Platelets: 249 10*3/uL (ref 150–400)
RBC: 4.25 MIL/uL (ref 3.87–5.11)
RDW: 13.9 % (ref 11.5–15.5)
WBC: 7.5 10*3/uL (ref 4.0–10.5)

## 2012-05-10 LAB — POCT GLYCOSYLATED HEMOGLOBIN (HGB A1C): Hemoglobin A1C: 6.8

## 2012-05-10 LAB — GLUCOSE, CAPILLARY: Glucose-Capillary: 189 mg/dL — ABNORMAL HIGH (ref 70–99)

## 2012-05-10 MED ORDER — FLUTICASONE PROPIONATE 50 MCG/ACT NA SUSP: 1.0000 | Freq: Every day | NASAL | Status: DC

## 2012-05-10 NOTE — Patient Instructions (Signed)
General Instructions:  We are not changing any medicines today. We will try flonase for your runny nose. Use 1 spray in each nostril once a day to help. Call us with questions or problems at [phone].  Come back in 6 months. We are giving you a pneumonia shot today.  Treatment Goals:  Goals (1 Years of Data) as of 05/10/2012    None      Progress Toward Treatment Goals:  Treatment Goal 05/10/2012  Hemoglobin A1C at goal  Blood pressure at goal  Stop smoking smoking the same amount    Self Care Goals & Plans:  Self Care Goal 05/10/2012  Manage my medications take my medicines as prescribed  Monitor my health check my feet daily       Care Management & Community Referrals:  Referral 05/10/2012  Referrals made for care management support none needed

## 2012-05-10 NOTE — Assessment & Plan Note (Signed)
BP Readings from Last 3 Encounters:  05/10/12 122/68  04/20/12 140/88  04/09/12 126/63    Lab Results  Component Value Date   NA 134* 04/21/2012   K 3.8 04/21/2012   CREATININE 0.96 04/21/2012    Assessment:  Blood pressure control: controlled  Progress toward BP goal:  at goal  Comments:   Plan:  Medications:  continue current medications, HCTZ, lopressor, will resume ACE if Cr stable  Educational resources provided: brochure  Self management tools provided:    Other plans:

## 2012-05-10 NOTE — Progress Notes (Signed)
Subjective:     Patient ID: Leah Kaufman, female   DOB: July 12, 1928, 79 y.o.   MRN: [id number]  HPI Patient is an 79 year old to returns for a hospital followup visit and check of her diabetes. She has finished her course of Bactrim for urinary tract infection and states that all her symptoms have resolved. She did take the entire course. She is not having any nausea or vomiting currently. She has been able to resume eating and drinking very well. She has not been dizzy or fallen at home. She is not having any low sugars. She states her sugars are back under control. She has been able to take her medications as prescribed. She has no complaints at today's visit. She would like to get pneumonia shot. She states she got a flu shot already. No other complaints except runny nose at today's visit. No chest pain or shortness of breath.  Review of Systems  Constitutional: Negative.   Respiratory: Negative.  Negative for cough, chest tightness, shortness of breath and wheezing.   Cardiovascular: Negative.  Negative for chest pain, palpitations and leg swelling.  Gastrointestinal: Negative.   Musculoskeletal: Negative.   Skin: Negative.   Neurological: Negative.   Hematological: Negative.   Psychiatric/Behavioral: Negative.        Objective:   Physical Exam  Constitutional: She is oriented to person, place, and time. She appears well-developed and well-nourished.  HENT:  Head: Normocephalic and atraumatic.  Eyes: EOM are normal. Pupils are equal, round, and reactive to light.  Neck: Normal range of motion. Neck supple.  Cardiovascular: Normal rate and regular rhythm.   Pulmonary/Chest: Effort normal and breath sounds normal.  Abdominal: Soft. Bowel sounds are normal.  Musculoskeletal: Normal range of motion.  Neurological: She is alert and oriented to person, place, and time.  Skin: Skin is warm and dry.  Psychiatric: She has a normal mood and affect. Her behavior is normal. Judgment and  thought content normal.       Assessment/Plan:   1. Please see problem oriented charting.  2. Disposition- the patient be seen back in 6 months for followup. She will get pneumonia shot at today's visit. We'll recheck CBC and BMP at today's visit. If creatinine is stable will restart enalapril. Will trial Flonase for her nose running.

## 2012-05-10 NOTE — Assessment & Plan Note (Addendum)
On zocor, continue 

## 2012-05-10 NOTE — Assessment & Plan Note (Signed)
  Assessment:  Progress toward smoking cessation:  smoking the same amount  Barriers to progress toward smoking cessation:  lack of motivation to quit  Comments:   Plan:  Instruction/counseling given:  I advised patient to stop smoking.  Educational resources provided:  [employer]) brochure  Self management tools provided:     Medications to assist with smoking cessation:  none Patient agreed to the following self-care plans for smoking cessation:      Other:

## 2012-05-10 NOTE — Assessment & Plan Note (Signed)
Lab Results  Component Value Date   HGBA1C 6.8 05/10/2012   HGBA1C 6.7 11/13/2011   HGBA1C 6.9 09/02/2010     Assessment:  Diabetes control: good control (HgbA1C at goal)  Progress toward A1C goal:  at goal  Comments: none  Plan:  Medications:  continue current medications, glipizide  Home glucose monitoring:   Frequency:     Timing:    Instruction/counseling given: reminded to get eye exam  Educational resources provided: brochure  Self management tools provided:    Other plans:

## 2012-05-11 LAB — BASIC METABOLIC PANEL WITH GFR
BUN: 23 mg/dL (ref 6–23)
CO2: 30 mEq/L (ref 19–32)
Chloride: 103 mEq/L (ref 96–112)
GFR, Est Non African American: 42 mL/min — ABNORMAL LOW
Potassium: 3.8 mEq/L (ref 3.5–5.3)

## 2012-08-05 ENCOUNTER — Ambulatory Visit (HOSPITAL_COMMUNITY)
Admission: RE | Admit: 2012-08-05 | Discharge: 2012-08-05 | Disposition: A | Discharge status: Home / Self Care | Payer: Medicare Other | Source: Ambulatory Visit | Attending: Internal Medicine | Admitting: Internal Medicine

## 2012-08-05 ENCOUNTER — Encounter: Payer: Self-pay | Admitting: Internal Medicine

## 2012-08-05 ENCOUNTER — Ambulatory Visit (INDEPENDENT_AMBULATORY_CARE_PROVIDER_SITE_OTHER): Payer: Medicare Other | Admitting: Internal Medicine

## 2012-08-05 VITALS — BP 150/69 | HR 67 | Temp 96.5°F | Ht 61.0 in | Wt 105.6 lb

## 2012-08-05 DIAGNOSIS — M5432 Sciatica, left side: Secondary | ICD-10-CM

## 2012-08-05 DIAGNOSIS — M545 Low back pain, unspecified: Secondary | ICD-10-CM | POA: Insufficient documentation

## 2012-08-05 DIAGNOSIS — M25569 Pain in unspecified knee: Secondary | ICD-10-CM | POA: Insufficient documentation

## 2012-08-05 DIAGNOSIS — E119 Type 2 diabetes mellitus without complications: Secondary | ICD-10-CM

## 2012-08-05 DIAGNOSIS — F17200 Nicotine dependence, unspecified, uncomplicated: Secondary | ICD-10-CM

## 2012-08-05 DIAGNOSIS — M5430 Sciatica, unspecified side: Secondary | ICD-10-CM

## 2012-08-05 DIAGNOSIS — R29890 Loss of height: Secondary | ICD-10-CM | POA: Insufficient documentation

## 2012-08-05 DIAGNOSIS — G9589 Other specified diseases of spinal cord: Secondary | ICD-10-CM | POA: Insufficient documentation

## 2012-08-05 DIAGNOSIS — I10 Essential (primary) hypertension: Secondary | ICD-10-CM

## 2012-08-05 DIAGNOSIS — Q762 Congenital spondylolisthesis: Secondary | ICD-10-CM | POA: Insufficient documentation

## 2012-08-05 NOTE — Patient Instructions (Addendum)
**You can take Tylenol 325mg  every 4-6 hours as needed for your pain. You can also take Ibuprofen 200-400mg  every 6-8 hours as needed for your pain.  Please do not take the ibuprofen for more than 7 days; it can affect your kidneys.   **I am also attaching some stretches for you to do for your pain. I do want to check an X-ray of your back to make sure I am not missing a fracture, given that you have osteoporosis.   **Please call the clinic if your pain does not improve over the next week.   Sciatica with Rehab The sciatic nerve runs from the back down the leg and is responsible for sensation and control of the muscles in the back (posterior) side of the thigh, lower leg, and foot. Sciatica is a condition that is characterized by inflammation of this nerve.  SYMPTOMS   Signs of nerve damage, including numbness and/or weakness along the posterior side of the lower extremity.  Pain in the back of the thigh that may also travel down the leg.  Pain that worsens when sitting for long periods of time.  Occasionally, pain in the back or buttock. CAUSES  Inflammation of the sciatic nerve is the cause of sciatica. The inflammation is due to something irritating the nerve. Common sources of irritation include:  Sitting for long periods of time.  Direct trauma to the nerve.  Arthritis of the spine.  Herniated or ruptured disk.  Slipping of the vertebrae (spondylolithesis)  Pressure from soft tissues, such as muscles or ligament-like tissue (fascia). RISK INCREASES WITH:  Sports that place pressure or stress on the spine (football or weightlifting).  Poor strength and flexibility.  Failure to warm-up properly before activity.  Family history of low back pain or disk disorders.  Previous back injury or surgery.  Poor body mechanics, especially when lifting, or poor posture. PREVENTION   Warm up and stretch properly before activity.  Maintain physical fitness:  Strength,  flexibility, and endurance.  Cardiovascular fitness.  Learn and use proper technique, especially with posture and lifting. When possible, have coach correct improper technique.  Avoid activities that place stress on the spine. PROGNOSIS If treated properly, then sciatica usually resolves within 6 weeks. However, occasionally surgery is necessary.  RELATED COMPLICATIONS   Permanent nerve damage, including pain, numbness, tingle, or weakness.  Chronic back pain.  Risks of surgery: infection, bleeding, nerve damage, or damage to surrounding tissues. TREATMENT Treatment initially involves resting from any activities that aggravate your symptoms. The use of ice and medication may help reduce pain and inflammation. The use of strengthening and stretching exercises may help reduce pain with activity. These exercises may be performed at home or with referral to a therapist. A therapist may recommend further treatments, such as transcutaneous electronic nerve stimulation (TENS) or ultrasound. Your caregiver may recommend corticosteroid injections to help reduce inflammation of the sciatic nerve. If symptoms persist despite non-surgical (conservative) treatment, then surgery may be recommended. MEDICATION  If pain medication is necessary, then nonsteroidal anti-inflammatory medications, such as aspirin and ibuprofen, or other minor pain relievers, such as acetaminophen, are often recommended.  Do not take pain medication for 7 days before surgery.  Prescription pain relievers may be given if deemed necessary by your caregiver. Use only as directed and only as much as you need.  Ointments applied to the skin may be helpful.  Corticosteroid injections may be given by your caregiver. These injections should be reserved for the most serious  cases, because they may only be given a certain number of times. HEAT AND COLD  Cold treatment (icing) relieves pain and reduces inflammation. Cold treatment  should be applied for 10 to 15 minutes every 2 to 3 hours for inflammation and pain and immediately after any activity that aggravates your symptoms. Use ice packs or massage the area with a piece of ice (ice massage).  Heat treatment may be used prior to performing the stretching and strengthening activities prescribed by your caregiver, physical therapist, or athletic trainer. Use a heat pack or soak the injury in warm water. SEEK MEDICAL CARE IF:  Treatment seems to offer no benefit, or the condition worsens.  Any medications produce adverse side effects. EXERCISES  RANGE OF MOTION (ROM) AND STRETCHING EXERCISES - Sciatica Most people with sciatic will find that their symptoms worsen with either excessive bending forward (flexion) or arching at the low back (extension). The exercises which will help resolve your symptoms will focus on the opposite motion. Your physician, physical therapist or athletic trainer will help you determine which exercises will be most helpful to resolve your low back pain. Do not complete any exercises without first consulting with your clinician. Discontinue any exercises which worsen your symptoms until you speak to your clinician. If you have pain, numbness or tingling which travels down into your buttocks, leg or foot, the goal of the therapy is for these symptoms to move closer to your back and eventually resolve. Occasionally, these leg symptoms will get better, but your low back pain may worsen; this is typically an indication of progress in your rehabilitation. Be certain to be very alert to any changes in your symptoms and the activities in which you participated in the 24 hours prior to the change. Sharing this information with your clinician will allow him/her to most efficiently treat your condition. These exercises may help you when beginning to rehabilitate your injury. Your symptoms may resolve with or without further involvement from your physician, physical  therapist or athletic trainer. While completing these exercises, remember:   Restoring tissue flexibility helps normal motion to return to the joints. This allows healthier, less painful movement and activity.  An effective stretch should be held for at least 30 seconds.  A stretch should never be painful. You should only feel a gentle lengthening or release in the stretched tissue. FLEXION RANGE OF MOTION AND STRETCHING EXERCISES: STRETCH  Flexion, Single Knee to Chest   Lie on a firm bed or floor with both legs extended in front of you.  Keeping one leg in contact with the floor, bring your opposite knee to your chest. Hold your leg in place by either grabbing behind your thigh or at your knee.  Pull until you feel a gentle stretch in your low back. Hold __________ seconds.  Slowly release your grasp and repeat the exercise with the opposite side. Repeat __________ times. Complete this exercise __________ times per day.  STRETCH  Flexion, Double Knee to Chest  Lie on a firm bed or floor with both legs extended in front of you.  Keeping one leg in contact with the floor, bring your opposite knee to your chest.  Tense your stomach muscles to support your back and then lift your other knee to your chest. Hold your legs in place by either grabbing behind your thighs or at your knees.  Pull both knees toward your chest until you feel a gentle stretch in your low back. Hold __________ seconds.  Tense  your stomach muscles and slowly return one leg at a time to the floor. Repeat __________ times. Complete this exercise __________ times per day.  STRETCH  Low Trunk Rotation   Lie on a firm bed or floor. Keeping your legs in front of you, bend your knees so they are both pointed toward the ceiling and your feet are flat on the floor.  Extend your arms out to the side. This will stabilize your upper body by keeping your shoulders in contact with the floor.  Gently and slowly drop both  knees together to one side until you feel a gentle stretch in your low back. Hold for __________ seconds.  Tense your stomach muscles to support your low back as you bring your knees back to the starting position. Repeat the exercise to the other side. Repeat __________ times. Complete this exercise __________ times per day  EXTENSION RANGE OF MOTION AND FLEXIBILITY EXERCISES: STRETCH  Extension, Prone on Elbows  Lie on your stomach on the floor, a bed will be too soft. Place your palms about shoulder width apart and at the height of your head.  Place your elbows under your shoulders. If this is too painful, stack pillows under your chest.  Allow your body to relax so that your hips drop lower and make contact more completely with the floor.  Hold this position for __________ seconds.  Slowly return to lying flat on the floor. Repeat __________ times. Complete this exercise __________ times per day.  RANGE OF MOTION  Extension, Prone Press Ups  Lie on your stomach on the floor, a bed will be too soft. Place your palms about shoulder width apart and at the height of your head.  Keeping your back as relaxed as possible, slowly straighten your elbows while keeping your hips on the floor. You may adjust the placement of your hands to maximize your comfort. As you gain motion, your hands will come more underneath your shoulders.  Hold this position __________ seconds.  Slowly return to lying flat on the floor. Repeat __________ times. Complete this exercise __________ times per day.  STRENGTHENING EXERCISES - Sciatica  These exercises may help you when beginning to rehabilitate your injury. These exercises should be done near your "sweet spot." This is the neutral, low-back arch, somewhere between fully rounded and fully arched, that is your least painful position. When performed in this safe range of motion, these exercises can be used for people who have either a flexion or extension based  injury. These exercises may resolve your symptoms with or without further involvement from your physician, physical therapist or athletic trainer. While completing these exercises, remember:   Muscles can gain both the endurance and the strength needed for everyday activities through controlled exercises.  Complete these exercises as instructed by your physician, physical therapist or athletic trainer. Progress with the resistance and repetition exercises only as your caregiver advises.  You may experience muscle soreness or fatigue, but the pain or discomfort you are trying to eliminate should never worsen during these exercises. If this pain does worsen, stop and make certain you are following the directions exactly. If the pain is still present after adjustments, discontinue the exercise until you can discuss the trouble with your clinician. STRENGTHENING Deep Abdominals, Pelvic Tilt   Lie on a firm bed or floor. Keeping your legs in front of you, bend your knees so they are both pointed toward the ceiling and your feet are flat on the floor.  Tense  your lower abdominal muscles to press your low back into the floor. This motion will rotate your pelvis so that your tail bone is scooping upwards rather than pointing at your feet or into the floor.  With a gentle tension and even breathing, hold this position for __________ seconds. Repeat __________ times. Complete this exercise __________ times per day.  STRENGTHENING  Abdominals, Crunches   Lie on a firm bed or floor. Keeping your legs in front of you, bend your knees so they are both pointed toward the ceiling and your feet are flat on the floor. Cross your arms over your chest.  Slightly tip your chin down without bending your neck.  Tense your abdominals and slowly lift your trunk high enough to just clear your shoulder blades. Lifting higher can put excessive stress on the low back and does not further strengthen your abdominal  muscles.  Control your return to the starting position. Repeat __________ times. Complete this exercise __________ times per day.  STRENGTHENING  Quadruped, Opposite UE/LE Lift  Assume a hands and knees position on a firm surface. Keep your hands under your shoulders and your knees under your hips. You may place padding under your knees for comfort.  Find your neutral spine and gently tense your abdominal muscles so that you can maintain this position. Your shoulders and hips should form a rectangle that is parallel with the floor and is not twisted.  Keeping your trunk steady, lift your right hand no higher than your shoulder and then your left leg no higher than your hip. Make sure you are not holding your breath. Hold this position __________ seconds.  Continuing to keep your abdominal muscles tense and your back steady, slowly return to your starting position. Repeat with the opposite arm and leg. Repeat __________ times. Complete this exercise __________ times per day.  STRENGTHENING  Abdominals and Quadriceps, Straight Leg Raise   Lie on a firm bed or floor with both legs extended in front of you.  Keeping one leg in contact with the floor, bend the other knee so that your foot can rest flat on the floor.  Find your neutral spine, and tense your abdominal muscles to maintain your spinal position throughout the exercise.  Slowly lift your straight leg off the floor about 6 inches for a count of 15, making sure to not hold your breath.  Still keeping your neutral spine, slowly lower your leg all the way to the floor. Repeat this exercise with each leg __________ times. Complete this exercise __________ times per day. POSTURE AND BODY MECHANICS CONSIDERATIONS - Sciatica Keeping correct posture when sitting, standing or completing your activities will reduce the stress put on different body tissues, allowing injured tissues a chance to heal and limiting painful experiences. The following  are general guidelines for improved posture. Your physician or physical therapist will provide you with any instructions specific to your needs. While reading these guidelines, remember:  The exercises prescribed by your provider will help you have the flexibility and strength to maintain correct postures.  The correct posture provides the optimal environment for your joints to work. All of your joints have less wear and tear when properly supported by a spine with good posture. This means you will experience a healthier, less painful body.  Correct posture must be practiced with all of your activities, especially prolonged sitting and standing. Correct posture is as important when doing repetitive low-stress activities (typing) as it is when doing a single heavy-load  activity (lifting). RESTING POSITIONS Consider which positions are most painful for you when choosing a resting position. If you have pain with flexion-based activities (sitting, bending, stooping, squatting), choose a position that allows you to rest in a less flexed posture. You would want to avoid curling into a fetal position on your side. If your pain worsens with extension-based activities (prolonged standing, working overhead), avoid resting in an extended position such as sleeping on your stomach. Most people will find more comfort when they rest with their spine in a more neutral position, neither too rounded nor too arched. Lying on a non-sagging bed on your side with a pillow between your knees, or on your back with a pillow under your knees will often provide some relief. Keep in mind, being in any one position for a prolonged period of time, no matter how correct your posture, can still lead to stiffness. PROPER SITTING POSTURE In order to minimize stress and discomfort on your spine, you must sit with correct posture Sitting with good posture should be effortless for a healthy body. Returning to good posture is a gradual  process. Many people can work toward this most comfortably by using various supports until they have the flexibility and strength to maintain this posture on their own. When sitting with proper posture, your ears will fall over your shoulders and your shoulders will fall over your hips. You should use the back of the chair to support your upper back. Your low back will be in a neutral position, just slightly arched. You may place a small pillow or folded towel at the base of your low back for support.  When working at a desk, create an environment that supports good, upright posture. Without extra support, muscles fatigue and lead to excessive strain on joints and other tissues. Keep these recommendations in mind: CHAIR:   A chair should be able to slide under your desk when your back makes contact with the back of the chair. This allows you to work closely.  The chair's height should allow your eyes to be level with the upper part of your monitor and your hands to be slightly lower than your elbows. BODY POSITION  Your feet should make contact with the floor. If this is not possible, use a foot rest.  Keep your ears over your shoulders. This will reduce stress on your neck and low back. INCORRECT SITTING POSTURES   If you are feeling tired and unable to assume a healthy sitting posture, do not slouch or slump. This puts excessive strain on your back tissues, causing more damage and pain. Healthier options include:  Using more support, like a lumbar pillow.  Switching tasks to something that requires you to be upright or walking.  Talking a brief walk.  Lying down to rest in a neutral-spine position. PROLONGED STANDING WHILE SLIGHTLY LEANING FORWARD  When completing a task that requires you to lean forward while standing in one place for a long time, place either foot up on a stationary 2-4 inch high object to help maintain the best posture. When both feet are on the ground, the low back  tends to lose its slight inward curve. If this curve flattens (or becomes too large), then the back and your other joints will experience too much stress, fatigue more quickly and can cause pain.  CORRECT STANDING POSTURES Proper standing posture should be assumed with all daily activities, even if they only take a few moments, like when brushing your teeth.  As in sitting, your ears should fall over your shoulders and your shoulders should fall over your hips. You should keep a slight tension in your abdominal muscles to brace your spine. Your tailbone should point down to the ground, not behind your body, resulting in an over-extended swayback posture.  INCORRECT STANDING POSTURES  Common incorrect standing postures include a forward head, locked knees and/or an excessive swayback. WALKING Walk with an upright posture. Your ears, shoulders and hips should all line-up. PROLONGED ACTIVITY IN A FLEXED POSITION When completing a task that requires you to bend forward at your waist or lean over a low surface, try to find a way to stabilize 3 of 4 of your limbs. You can place a hand or elbow on your thigh or rest a knee on the surface you are reaching across. This will provide you more stability so that your muscles do not fatigue as quickly. By keeping your knees relaxed, or slightly bent, you will also reduce stress across your low back. CORRECT LIFTING TECHNIQUES DO :   Assume a wide stance. This will provide you more stability and the opportunity to get as close as possible to the object which you are lifting.  Tense your abdominals to brace your spine; then bend at the knees and hips. Keeping your back locked in a neutral-spine position, lift using your leg muscles. Lift with your legs, keeping your back straight.  Test the weight of unknown objects before attempting to lift them.  Try to keep your elbows locked down at your sides in order get the best strength from your shoulders when carrying an  object.  Always ask for help when lifting heavy or awkward objects. INCORRECT LIFTING TECHNIQUES DO NOT:   Lock your knees when lifting, even if it is a small object.  Bend and twist. Pivot at your feet or move your feet when needing to change directions.  Assume that you cannot safely pick up a paperclip without proper posture. Document Released: 03/27/2005 Document Revised: 06/19/2011 Document Reviewed: 07/09/2008 [facility] Patient Information 2013 [place], [place].

## 2012-08-05 NOTE — Assessment & Plan Note (Signed)
Pain in left lower back/buttock radiating into her posterior left thigh x3days that is worse with ambulation. Positive straight leg raise and pain with flexion and extension. She denies previous similar pain and denies trauma. No recent imaging. Given her h/o osteoporosis, will check lumbar spine films to r/o acute fracture. Will start OTC pain medication but will escalate if fracture present. Provided exercises and stretches desined to aleviate sciatic pain. She is to call the clinic if her pain does not improve over the next week. She might need PT or injection therapy.  - Tylenol/Advil PRN pain - Pt told to take Advil 1-2 tabs q6-8hr PRN for 7 days only given renal fx.

## 2012-08-05 NOTE — Assessment & Plan Note (Addendum)
Lab Results  Component Value Date   HGBA1C 6.3 08/05/2012   HGBA1C 6.8 05/10/2012   HGBA1C 6.7 11/13/2011     Assessment: Diabetes control: good control (HgbA1C at goal) Progress toward A1C goal:  improved   Plan: Medications:  continue current medications Home glucose monitoring: Frequency: no home glucose monitoring Timing:   Instruction/counseling given: reminded to get eye exam, reminded to bring medications to each visit and discussed foot care Educational resources provided: brochure;handout Self management tools provided:    **Ordered eye exam

## 2012-08-05 NOTE — Assessment & Plan Note (Addendum)
BP Readings from Last 3 Encounters:  08/05/12 150/69  05/10/12 122/68  04/20/12 140/88    Lab Results  Component Value Date   NA 141 05/10/2012   K 3.8 05/10/2012   CREATININE 1.21* 05/10/2012    Assessment: Blood pressure control: moderately elevated Progress toward BP goal:  deteriorated Comments: Likely 2/2 pain, will continue current regimen and reevaluate at her next visit  Plan: Medications:  continue current medications Educational resources provided: brochure;handout;video Self management tools provided:

## 2012-08-05 NOTE — Assessment & Plan Note (Signed)
  Assessment: Progress toward smoking cessation:  smoking less Barriers to progress toward smoking cessation:  lack of motivation to quit Comments: Trying to cut back, was smoking > 1ppd  Plan: Instruction/counseling given:  I advised patient to stop smoking and reviewed strategies to maximize success. Educational resources provided:  [employer]) brochure Self management tools provided:    Medications to assist with smoking cessation:  None Patient agreed to the following self-care plans for smoking cessation: call [facility] (1-800-QUIT-NOW)

## 2012-08-05 NOTE — Progress Notes (Signed)
Patient ID: Leah Kaufman, female   DOB: 08-18-1928, 79 y.o.   MRN: [id number]  Subjective:   Patient ID: Leah Kaufman female   DOB: [birth year] 79 y.o.   MRN: [id number]  HPI: Ms.Leah Kaufman is a 79 y.o. female with PMH DM2, HTN, hyperlipidemia, and COPD presents to the clinic for an acute visit.  C/o pain from left lower back/buttock that radiates down into her posterior thigh into her left knee x 3 days. Her symptoms were noticed when walking and are worse at that time. She denies any trauma or previous history of pain like this. She denies pain anywhere else, including her spine. She does have a h/o of osteoporosis and is currently on Fosamax qweek. She has not had any previous imaging of her spine.   Past Medical History  Diagnosis Date  . Hypertension   . Hyperlipidemia   . WOUND, HAND 08/01/2007  . OSTEOPOROSIS 08/14/2006  . HYSTERECTOMY, HX OF 08/14/2006  . Urinary incontinence 09/05/2010  . DIABETES MELLITUS, TYPE II 08/14/2006  . HYPERLIPIDEMIA 08/14/2006  . Hypercalcemia 10/08/2009  . TOBACCO ABUSE 08/14/2006  . HYPERTENSION 08/14/2006  . VENOUS INSUFFICIENCY 08/14/2006   Current Outpatient Prescriptions  Medication Sig Dispense Refill  . albuterol (PROVENTIL HFA;VENTOLIN HFA) 108 (90 BASE) MCG/ACT inhaler Inhale 2 puffs into the lungs every 4 (four) hours as needed for wheezing or shortness of breath.  1 Inhaler  2  . alendronate (FOSAMAX) 70 MG tablet Take 70 mg by mouth every 7 (seven) days. On Sunday; Take with a full glass of water on an empty stomach.      Marland Kitchen aspirin 81 MG tablet Take 81 mg by mouth daily.        . Calcium Carbonate-Vitamin D (CALCIUM 600+D) 600-400 MG-UNIT per tablet Take 1 tablet by mouth daily.        . fluticasone (FLONASE) 50 MCG/ACT nasal spray Place 1 spray into the nose daily.  16 g  2  . glipiZIDE (GLUCOTROL) 5 MG tablet Take 1 tablet (5 mg total) by mouth 2 (two) times daily before a meal.  180 tablet  3  . hydrochlorothiazide (HYDRODIURIL) 50 MG  tablet Take 1 tablet (50 mg total) by mouth daily.  90 tablet  3  . metoprolol (LOPRESSOR) 100 MG tablet Take 1 tablet (100 mg total) by mouth 2 (two) times daily.  180 tablet  3  . simvastatin (ZOCOR) 40 MG tablet Take 1 tablet (40 mg total) by mouth at bedtime.  90 tablet  3   No current facility-administered medications for this visit.   Family History  Problem Relation Age of Onset  . Cancer Other   . Diabetes Other    History   Social History  . Marital Status: Widowed    Spouse Name: N/A    Number of Children: N/A  . Years of Education: N/A   Social History Main Topics  . Smoking status: Current Every Day Smoker -- 1.00 packs/day    Types: Cigarettes  . Smokeless tobacco: None     Comment: pt states she's been smoking since 79yo and has no intentions of stopping  . Alcohol Use: No  . Drug Use: No  . Sexually Active: No   Other Topics Concern  . None   Social History Narrative  . None   Review of Systems: A 10 point ROS was performed; pertinent positives and negatives were noted in the HPI   Objective:  Physical Exam: Filed Vitals:  08/05/12 1406  BP: 150/69  Pulse: 67  Temp: 96.5 F (35.8 C)  TempSrc: Oral  Height: 5\' 1"  (1.549 m)  Weight: 105 lb 9.6 oz (47.9 kg)  SpO2: 95%   Constitutional: Vital signs reviewed.  Patient is a well-developed and well-nourished female in no acute distress and cooperative with exam. A&Ox3. Head: Normocephalic and atraumatic Eyes: PERRL, EOMI, conjunctivae normal, No scleral icterus.  Cardiovascular: RRR, S1 normal, S2 normal, no MRG, pulses symmetric and intact bilaterally Pulmonary/Chest: CTAB, no wheezes, rales, or rhonchi Abdominal: Non-tender, non-distended Musculoskeletal: No joint deformities, erythema, or stiffness. +straight leg raise, pain with flexion and extension at the left hip. Skin: Warm, dry and intact. No rash, cyanosis, or clubbing.  Psychiatric: Normal mood and affect.   Assessment & Plan:    Please refer to Problem List based Assessment and Plan

## 2012-08-12 NOTE — Progress Notes (Signed)
Case discussed with Dr. Kathryn Glenn at the time of the visit, immediately after the resident saw the patient.  I reviewed the resident's history and exam and pertinent patient test results.  I agree with the assessment, diagnosis and plan of care documented in the resident's note.     

## 2012-08-13 ENCOUNTER — Ambulatory Visit (INDEPENDENT_AMBULATORY_CARE_PROVIDER_SITE_OTHER): Payer: Medicare Other | Admitting: Internal Medicine

## 2012-08-13 ENCOUNTER — Encounter: Payer: Self-pay | Admitting: Internal Medicine

## 2012-08-13 VITALS — BP 140/67 | HR 74 | Temp 97.8°F | Ht 61.0 in | Wt 106.6 lb

## 2012-08-13 DIAGNOSIS — M5432 Sciatica, left side: Secondary | ICD-10-CM

## 2012-08-13 DIAGNOSIS — M5430 Sciatica, unspecified side: Secondary | ICD-10-CM

## 2012-08-13 DIAGNOSIS — E119 Type 2 diabetes mellitus without complications: Secondary | ICD-10-CM

## 2012-08-13 LAB — BASIC METABOLIC PANEL WITH GFR
CO2: 29 mEq/L (ref 19–32)
Chloride: 98 mEq/L (ref 96–112)
Glucose, Bld: 198 mg/dL — ABNORMAL HIGH (ref 70–99)
Potassium: 3.3 mEq/L — ABNORMAL LOW (ref 3.5–5.3)
Sodium: 136 mEq/L (ref 135–145)

## 2012-08-13 LAB — GLUCOSE, CAPILLARY: Glucose-Capillary: 223 mg/dL — ABNORMAL HIGH (ref 70–99)

## 2012-08-13 MED ORDER — ACETAMINOPHEN-CODEINE 300-30 MG PO TABS: 1.0000 | ORAL_TABLET | Freq: Two times a day (BID) | ORAL | Status: DC | PRN

## 2012-08-13 MED ORDER — NAPROXEN 500 MG PO TABS: 500.0000 mg | ORAL_TABLET | Freq: Two times a day (BID) | ORAL | Status: DC

## 2012-08-13 MED ORDER — OMEPRAZOLE 40 MG PO CPDR: 40.0000 mg | DELAYED_RELEASE_CAPSULE | Freq: Every day | ORAL | Status: DC

## 2012-08-13 NOTE — Assessment & Plan Note (Signed)
Likely persistent sciatica pain. X-ray of the lumbar spine which was obtained on 08/05/12 was negative for any acute process including compression fractures. I will treat patient symptomatically more aggressive with naproxen 500 mg twice a day for 2 weeks. Further prescribed Tylenol #32 tablets when necessary 30 tablets were also given for pain relief since patient is going to [place] on Friday. Omeprazole was prescribed along with naproxen. I advised patient to go to the emergency room as soon as possible if she is experiencing any weakness in her legs, urinary or bowel incontinence, fevers. Reevaluate patient in 2 weeks.

## 2012-08-13 NOTE — Patient Instructions (Addendum)
The back pain is coming from the nerve impingement.  Please take Naprosyn 500 mg twice a day with meals along with omeprazole 40 mg daily ( differential protect your stomach) If you're in pain you can take Tylenol #3 twice a day as needed .  If you are experiencing leg weakness, bladder or bowel incontinence, fevers or rashes please go to the next emergency room to get evaluated.

## 2012-08-13 NOTE — Progress Notes (Signed)
Subjective:   Patient ID: Leah Kaufman female   DOB: 01-08-1929 79 y.o.   MRN: [id number]  HPI: Ms.Leah Kaufman is a 79 y.o. female with past medical history significant as outlined below who presented to the clinic for a followup. Patient was evaluated with sciatica pain in 4/28. She was prescribed Tylenol and ibuprofen when necessary for pain. Patient reports the pain continues to be present. Sugars took Tylenol without any significant relief. Patient describes the pain as a shooting pain which radiates from her buttock area down to her left leg. It is not associated with any weakness, changes in bowel habits including urinary or bowel incontinence. Patient denies any fevers chills or rashes. Denies any trauma or heavy lifting. Patient is planning to go to [place] on Friday for 2 weeks with her daughter.    Past Medical History  Diagnosis Date  . Hypertension   . Hyperlipidemia   . WOUND, HAND 08/01/2007  . OSTEOPOROSIS 08/14/2006  . HYSTERECTOMY, HX OF 08/14/2006  . Urinary incontinence 09/05/2010  . DIABETES MELLITUS, TYPE II 08/14/2006  . HYPERLIPIDEMIA 08/14/2006  . Hypercalcemia 10/08/2009  . TOBACCO ABUSE 08/14/2006  . HYPERTENSION 08/14/2006  . VENOUS INSUFFICIENCY 08/14/2006   Current Outpatient Prescriptions  Medication Sig Dispense Refill  . albuterol (PROVENTIL HFA;VENTOLIN HFA) 108 (90 BASE) MCG/ACT inhaler Inhale 2 puffs into the lungs every 4 (four) hours as needed for wheezing or shortness of breath.  1 Inhaler  2  . alendronate (FOSAMAX) 70 MG tablet Take 70 mg by mouth every 7 (seven) days. On Sunday; Take with a full glass of water on an empty stomach.      Marland Kitchen aspirin 81 MG tablet Take 81 mg by mouth daily.        . Calcium Carbonate-Vitamin D (CALCIUM 600+D) 600-400 MG-UNIT per tablet Take 1 tablet by mouth daily.        . fluticasone (FLONASE) 50 MCG/ACT nasal spray Place 1 spray into the nose daily.  16 g  2  . glipiZIDE (GLUCOTROL) 5 MG tablet Take 1 tablet (5 mg  total) by mouth 2 (two) times daily before a meal.  180 tablet  3  . hydrochlorothiazide (HYDRODIURIL) 50 MG tablet Take 1 tablet (50 mg total) by mouth daily.  90 tablet  3  . metoprolol (LOPRESSOR) 100 MG tablet Take 1 tablet (100 mg total) by mouth 2 (two) times daily.  180 tablet  3  . simvastatin (ZOCOR) 40 MG tablet Take 1 tablet (40 mg total) by mouth at bedtime.  90 tablet  3   No current facility-administered medications for this visit.   Family History  Problem Relation Age of Onset  . Cancer Other   . Diabetes Other    History   Social History  . Marital Status: Widowed    Spouse Name: N/A    Number of Children: N/A  . Years of Education: N/A   Social History Main Topics  . Smoking status: Current Every Day Smoker -- 1.00 packs/day    Types: Cigarettes  . Smokeless tobacco: None     Comment: pt states she's been smoking since 79yo and has no intentions of stopping  . Alcohol Use: No  . Drug Use: No  . Sexually Active: No   Other Topics Concern  . None   Social History Narrative  . None   Review of Systems: Constitutional: Denies fever, chills, diaphoresis, appetite change and fatigue.  Respiratory: Denies SOB, DOE, cough, chest tightness,  and wheezing.   Cardiovascular: Denies chest pain, palpitations and leg swelling.  Musculoskeletal: Noted  back pain Skin: Denies pallor, rash and wound.  Neurological: Denies dizziness, weakness, light-headedness, numbness and headaches.    Objective:  Physical Exam: Filed Vitals:   08/13/12 1034  BP: 140/67  Pulse: 74  Temp: 97.8 F (36.6 C)  TempSrc: Oral  Height: 5\' 1"  (1.549 m)  Weight: 106 lb 9.6 oz (48.353 kg)  SpO2: 95%   Constitutional: Vital signs reviewed.  Patient is a well-developed and well-nourished female in no acute distress and cooperative with exam. Alert and oriented x3.  Eyes: PERRL, EOMI, conjunctivae normal, No scleral icterus.  Neck: Supple,  Cardiovascular: RRR, S1 normal, S2 normal, no  MRG, pulses symmetric and intact bilaterally Pulmonary/Chest: CTAB, no wheezes, rales, or rhonchi Abdominal: Soft. Non-tender, non-distended, bowel sounds are normal,  Musculoskeletal: Tenderness to palpation at the lumbar paraspinal area as well as gluteal area mostly on the left. No rashes noted. No erythema. Straight leg raise of left leg is positive.   Neurological: A&O x3, Strength is normal and symmetric bilaterally, no focal motor deficit, sensory intact to light touch bilaterally.  Skin: Warm, dry and intact. No rash, cyanosis, or clubbing.

## 2012-08-14 ENCOUNTER — Other Ambulatory Visit: Payer: Self-pay | Admitting: Internal Medicine

## 2012-08-14 DIAGNOSIS — I10 Essential (primary) hypertension: Secondary | ICD-10-CM

## 2012-08-14 DIAGNOSIS — E876 Hypokalemia: Secondary | ICD-10-CM | POA: Insufficient documentation

## 2012-08-14 MED ORDER — HYDROCHLOROTHIAZIDE 25 MG PO TABS: 25.0000 mg | ORAL_TABLET | Freq: Every day | ORAL | Status: DC

## 2012-08-14 MED ORDER — POTASSIUM CHLORIDE ER 10 MEQ PO TBCR: 20.0000 meq | EXTENDED_RELEASE_TABLET | Freq: Every day | ORAL | Status: DC

## 2012-08-14 NOTE — Assessment & Plan Note (Signed)
Needs repeat Bmet during next office visit

## 2012-08-14 NOTE — Progress Notes (Signed)
Case discussed with Dr. Loistine Chance at the time of the visit. We reviewed the resident's history and exam and pertinent patient test results. I agree with the assessment, diagnosis and plan of care documented in the resident's note.

## 2012-08-14 NOTE — Assessment & Plan Note (Addendum)
No benefit of HCTZ of 50 mg daily in controlling BP especially considering patient's GFR of 47. Consider to d/c completely  During next office visit

## 2012-08-26 ENCOUNTER — Ambulatory Visit: Payer: Medicare Other | Admitting: Internal Medicine

## 2012-08-30 ENCOUNTER — Other Ambulatory Visit: Payer: Self-pay | Admitting: Internal Medicine

## 2012-10-17 ENCOUNTER — Other Ambulatory Visit: Payer: Self-pay

## 2012-11-08 ENCOUNTER — Other Ambulatory Visit: Payer: Self-pay | Admitting: Internal Medicine

## 2012-11-29 ENCOUNTER — Other Ambulatory Visit: Payer: Self-pay | Admitting: *Deleted

## 2012-11-29 DIAGNOSIS — I10 Essential (primary) hypertension: Secondary | ICD-10-CM

## 2012-11-29 DIAGNOSIS — E785 Hyperlipidemia, unspecified: Secondary | ICD-10-CM

## 2012-11-30 MED ORDER — SIMVASTATIN 40 MG PO TABS: 40.0000 mg | ORAL_TABLET | Freq: Every day | ORAL | Status: DC

## 2012-11-30 MED ORDER — METOPROLOL TARTRATE 100 MG PO TABS: 100.0000 mg | ORAL_TABLET | Freq: Two times a day (BID) | ORAL | Status: DC

## 2012-11-30 MED ORDER — HYDROCHLOROTHIAZIDE 25 MG PO TABS: 25.0000 mg | ORAL_TABLET | Freq: Every day | ORAL | Status: DC

## 2012-11-30 MED ORDER — ALENDRONATE SODIUM 70 MG PO TABS: ORAL_TABLET | ORAL | Status: DC

## 2012-12-11 ENCOUNTER — Encounter: Payer: Self-pay | Admitting: Internal Medicine

## 2012-12-27 ENCOUNTER — Encounter: Payer: Self-pay | Admitting: Internal Medicine

## 2012-12-27 ENCOUNTER — Ambulatory Visit (INDEPENDENT_AMBULATORY_CARE_PROVIDER_SITE_OTHER): Payer: Medicare Other | Admitting: Internal Medicine

## 2012-12-27 VITALS — BP 138/65 | HR 74 | Temp 97.4°F | Ht 60.75 in | Wt 97.2 lb

## 2012-12-27 DIAGNOSIS — E119 Type 2 diabetes mellitus without complications: Secondary | ICD-10-CM

## 2012-12-27 DIAGNOSIS — I10 Essential (primary) hypertension: Secondary | ICD-10-CM

## 2012-12-27 DIAGNOSIS — F17200 Nicotine dependence, unspecified, uncomplicated: Secondary | ICD-10-CM

## 2012-12-27 DIAGNOSIS — M5430 Sciatica, unspecified side: Secondary | ICD-10-CM

## 2012-12-27 DIAGNOSIS — M5432 Sciatica, left side: Secondary | ICD-10-CM

## 2012-12-27 DIAGNOSIS — E785 Hyperlipidemia, unspecified: Secondary | ICD-10-CM

## 2012-12-27 DIAGNOSIS — M810 Age-related osteoporosis without current pathological fracture: Secondary | ICD-10-CM

## 2012-12-27 MED ORDER — FLUCONAZOLE 150 MG PO TABS: 150.0000 mg | ORAL_TABLET | Freq: Every day | ORAL | Status: DC

## 2012-12-27 NOTE — Patient Instructions (Addendum)
We are not changing your medicines today and will see you back in 3 months. We will give you a medicine for yeast infection that you take 1 pill a day for 3 days.   We have given you a flu shot today.  Call us with questions or problems at [phone].

## 2012-12-28 NOTE — Assessment & Plan Note (Signed)
BP Readings from Last 3 Encounters:  12/27/12 138/65  08/13/12 140/67  08/05/12 150/69    Lab Results  Component Value Date   NA 136 08/13/2012   K 3.3* 08/13/2012   CREATININE 1.09 08/13/2012    Assessment: Blood pressure control: controlled Progress toward BP goal:  at goal Comments: she is tolerating well and will keep her on the current regimen  Plan: Medications:  continue current medications, lopressor 100 mg BID, HCTZ 25 mg daily Educational resources provided: brochure;handout Self management tools provided:   Other plans:

## 2012-12-28 NOTE — Assessment & Plan Note (Addendum)
She does continue to have this pain and can consider starting some gabapentin in the future. She does not wish to start today.

## 2012-12-28 NOTE — Assessment & Plan Note (Signed)
  Assessment: Progress toward smoking cessation:  smoking the same amount Barriers to progress toward smoking cessation:  lack of motivation to quit Comments:   Plan: Instruction/counseling given:  I counseled patient on the dangers of tobacco use, advised patient to stop smoking, and reviewed strategies to maximize success. Educational resources provided:  [employer] (1-800-QUIT-NOW) brochure Self management tools provided:    Medications to assist with smoking cessation:  None Patient agreed to the following self-care plans for smoking cessation:    Other plans:    

## 2012-12-28 NOTE — Assessment & Plan Note (Signed)
Lab Results  Component Value Date   HGBA1C 6.3 08/05/2012   HGBA1C 6.8 05/10/2012   HGBA1C 6.7 11/13/2011     Assessment: Diabetes control: good control (HgbA1C at goal) Progress toward A1C goal:  at goal Comments:   Plan: Medications:  continue current medications, glipizide 5 mg BID with meals Home glucose monitoring: Frequency: no home glucose monitoring Timing: N/A Instruction/counseling given: reminded to get eye exam and discussed foot care Educational resources provided: brochure;handout Self management tools provided:   Other plans: long toenails, diabetic foot exam performed today

## 2012-12-28 NOTE — Assessment & Plan Note (Signed)
Patient is still taking fosamax and will discontinue at the end of the year since likely starting date was the end of 2008 this will be about 5 years and benefit after this time is uncertain.

## 2012-12-28 NOTE — Progress Notes (Signed)
Subjective:     Patient ID: Leah Kaufman, female   DOB: 12/01/1928, 79 y.o.   MRN: [id number]  HPI  Patient is an 79 year old to returns for a routine visit.  She is doing okay at home although she has some mobility issues and has sciatic nerve pain that can be hard to tolerate. She is not having any nausea or vomiting currently. She denies chest pain. She has not been dizzy or fallen at home. She is not having any low sugars. She has no complaints at today's visit. She would like to get flu shot today. She is having some vaginal discharge and itching and would like to have some yeast medicine that works faster. No shortness of breath.  Review of Systems  Constitutional: Negative.   Respiratory: Negative.  Negative for cough, chest tightness, shortness of breath and wheezing.   Cardiovascular: Negative.  Negative for chest pain, palpitations and leg swelling.  Gastrointestinal: Negative.   Musculoskeletal: Negative.   Skin: Negative.   Neurological: Negative.   Psychiatric/Behavioral: Negative.        Objective:   Physical Exam  Constitutional: She is oriented to person, place, and time. She appears well-developed and well-nourished.  HENT:  Head: Normocephalic and atraumatic.  Eyes: EOM are normal. Pupils are equal, round, and reactive to light.  Neck: Normal range of motion. Neck supple.  Cardiovascular: Normal rate and regular rhythm.   Pulmonary/Chest: Effort normal and breath sounds normal.  Abdominal: Soft. Bowel sounds are normal.  Musculoskeletal: Normal range of motion.  Neurological: She is alert and oriented to person, place, and time.  Skin: Skin is warm and dry.  Psychiatric: She has a normal mood and affect. Her behavior is normal. Judgment and thought content normal.       Assessment/Plan:   1. Please see problem oriented charting.  2. Disposition- the patient be seen back in 3 months for followup. She got flu shot at today's visit. Will check HgA1c at next  visit and change interval to q 6 months.

## 2013-01-03 NOTE — Progress Notes (Signed)
Case discussed with Dr. Kollar soon after the resident saw the patient.  We reviewed the resident's history and exam and pertinent patient test results.  I agree with the assessment, diagnosis, and plan of care documented in the resident's note. 

## 2013-02-07 ENCOUNTER — Other Ambulatory Visit: Payer: Self-pay | Admitting: Internal Medicine

## 2013-02-18 ENCOUNTER — Other Ambulatory Visit: Payer: Self-pay | Admitting: Internal Medicine

## 2013-03-14 ENCOUNTER — Encounter: Payer: Medicare Other | Admitting: Internal Medicine

## 2013-03-14 ENCOUNTER — Encounter: Payer: Self-pay | Admitting: Internal Medicine

## 2013-03-14 ENCOUNTER — Encounter (INDEPENDENT_AMBULATORY_CARE_PROVIDER_SITE_OTHER): Payer: Self-pay

## 2013-03-14 ENCOUNTER — Ambulatory Visit (INDEPENDENT_AMBULATORY_CARE_PROVIDER_SITE_OTHER): Payer: Medicare Other | Admitting: Internal Medicine

## 2013-03-14 VITALS — BP 140/61 | HR 76 | Temp 97.2°F | Ht 61.0 in | Wt 102.0 lb

## 2013-03-14 DIAGNOSIS — F17200 Nicotine dependence, unspecified, uncomplicated: Secondary | ICD-10-CM

## 2013-03-14 DIAGNOSIS — I10 Essential (primary) hypertension: Secondary | ICD-10-CM

## 2013-03-14 DIAGNOSIS — E785 Hyperlipidemia, unspecified: Secondary | ICD-10-CM

## 2013-03-14 DIAGNOSIS — M810 Age-related osteoporosis without current pathological fracture: Secondary | ICD-10-CM

## 2013-03-14 DIAGNOSIS — E119 Type 2 diabetes mellitus without complications: Secondary | ICD-10-CM

## 2013-03-14 DIAGNOSIS — J449 Chronic obstructive pulmonary disease, unspecified: Secondary | ICD-10-CM

## 2013-03-14 LAB — LIPID PANEL
Cholesterol: 145 mg/dL (ref 0–200)
Triglycerides: 96 mg/dL (ref <150)

## 2013-03-14 MED ORDER — HYDROCHLOROTHIAZIDE 25 MG PO TABS: 25.0000 mg | ORAL_TABLET | Freq: Every day | ORAL | Status: DC

## 2013-03-14 NOTE — Patient Instructions (Addendum)
Stop taking fosamax.   We have refilled your medicines today but if you need more refills please call our clinic.   Come back in May or June for a checkup.   Call with problems or questions at [phone].

## 2013-03-14 NOTE — Assessment & Plan Note (Signed)
  Assessment: Progress toward smoking cessation:  smoking the same amount Barriers to progress toward smoking cessation:  lack of motivation to quit Comments:   Plan: Instruction/counseling given:  I counseled patient on the dangers of tobacco use, advised patient to stop smoking, and reviewed strategies to maximize success. Educational resources provided:  [employer]) brochure Self management tools provided:    Medications to assist with smoking cessation:  None Patient agreed to the following self-care plans for smoking cessation:    Other plans: One of her grandchildren quit recently and advised her to do the same as she mostly smokes with stress.

## 2013-03-14 NOTE — Assessment & Plan Note (Signed)
Breathing good and not needing albuterol currently, lungs sound clear.

## 2013-03-14 NOTE — Progress Notes (Signed)
Subjective:     Patient ID: Leah Kaufman, female   DOB: 1928-07-31, 79 y.o.   MRN: [id number]  HPI Patient is an 79 year old to returns for a routine visit.  She is doing okay at home although she has some mobility issues and has sciatic nerve pain that can be hard to tolerate. She is not having any nausea or vomiting currently. She denies chest pain. She has not been dizzy or fallen at home. She is not having any low sugars. She has no complaints at today's visit. No shortness of breath.  Review of Systems  Constitutional: Negative.   Respiratory: Negative.  Negative for cough, chest tightness, shortness of breath and wheezing.   Cardiovascular: Negative.  Negative for chest pain, palpitations and leg swelling.  Gastrointestinal: Negative.   Musculoskeletal: Negative.   Skin: Negative.   Neurological: Negative.   Psychiatric/Behavioral: Negative.        Objective:   Physical Exam  Constitutional: She is oriented to person, place, and time. She appears well-developed and well-nourished.  HENT:  Head: Normocephalic and atraumatic.  Eyes: EOM are normal. Pupils are equal, round, and reactive to light.  Neck: Normal range of motion. Neck supple.  Cardiovascular: Normal rate and regular rhythm.   Pulmonary/Chest: Effort normal and breath sounds normal.  Abdominal: Soft. Bowel sounds are normal.  Musculoskeletal: Normal range of motion.  Neurological: She is alert and oriented to person, place, and time.  Skin: Skin is warm and dry.  Psychiatric: She has a normal mood and affect. Her behavior is normal. Judgment and thought content normal.       Assessment/Plan:   1. Please see problem oriented charting.  2. Disposition- the patient be seen back in 6 months for followup. Checked HgA1c at today's visit and stop fosamax as she has been on for about 5 years.

## 2013-03-14 NOTE — Assessment & Plan Note (Signed)
Stop fosamax and continue calcium and vitamin d.

## 2013-03-14 NOTE — Assessment & Plan Note (Signed)
Lab Results  Component Value Date   HGBA1C 6.2 03/14/2013   HGBA1C 6.3 08/05/2012   HGBA1C 6.8 05/10/2012     Assessment: Diabetes control: good control (HgbA1C at goal) Progress toward A1C goal:  at goal Comments:   Plan: Medications:  continue current medications Home glucose monitoring: Frequency: once a day Timing: before breakfast Instruction/counseling given: discussed foot care Educational resources provided: brochure;handout Self management tools provided: instructions for home glucose monitoring Other plans:

## 2013-03-14 NOTE — Assessment & Plan Note (Signed)
Check lipid panel today. Last LDL 78.

## 2013-03-14 NOTE — Assessment & Plan Note (Signed)
BP Readings from Last 3 Encounters:  03/14/13 140/61  12/27/12 138/65  08/13/12 140/67    Lab Results  Component Value Date   NA 136 08/13/2012   K 3.3* 08/13/2012   CREATININE 1.09 08/13/2012    Assessment: Blood pressure control: controlled Progress toward BP goal:  at goal Comments:   Plan: Medications:  continue current medications, refilled HCTZ Educational resources provided: brochure;handout;video Self management tools provided: instructions for home blood pressure monitoring Other plans:

## 2013-03-31 NOTE — Progress Notes (Signed)
Case discussed with Dr. Kollar soon after the resident saw the patient.  We reviewed the resident's history and exam and pertinent patient test results.  I agree with the assessment, diagnosis, and plan of care documented in the resident's note. 

## 2013-05-08 ENCOUNTER — Other Ambulatory Visit: Payer: Self-pay | Admitting: Internal Medicine

## 2013-05-25 ENCOUNTER — Other Ambulatory Visit: Payer: Self-pay | Admitting: Internal Medicine

## 2013-08-08 ENCOUNTER — Ambulatory Visit (INDEPENDENT_AMBULATORY_CARE_PROVIDER_SITE_OTHER): Payer: Medicare PPO | Admitting: Internal Medicine

## 2013-08-08 ENCOUNTER — Other Ambulatory Visit: Payer: Self-pay | Admitting: Internal Medicine

## 2013-08-08 ENCOUNTER — Encounter: Payer: Self-pay | Admitting: Internal Medicine

## 2013-08-08 VITALS — BP 134/53 | HR 73 | Temp 96.6°F | Ht 62.5 in | Wt 104.0 lb

## 2013-08-08 DIAGNOSIS — J449 Chronic obstructive pulmonary disease, unspecified: Secondary | ICD-10-CM

## 2013-08-08 DIAGNOSIS — I10 Essential (primary) hypertension: Secondary | ICD-10-CM

## 2013-08-08 DIAGNOSIS — F17200 Nicotine dependence, unspecified, uncomplicated: Secondary | ICD-10-CM

## 2013-08-08 DIAGNOSIS — E119 Type 2 diabetes mellitus without complications: Secondary | ICD-10-CM

## 2013-08-08 LAB — POCT GLYCOSYLATED HEMOGLOBIN (HGB A1C): HEMOGLOBIN A1C: 6.7

## 2013-08-08 LAB — GLUCOSE, CAPILLARY: GLUCOSE-CAPILLARY: 191 mg/dL — AB (ref 70–99)

## 2013-08-08 NOTE — Progress Notes (Signed)
Subjective:     Patient ID: Leah Kaufman, female   DOB: 1929/02/02, 80 y.o.   MRN: [id number]  HPI Patient is an 80 year old to returns for a routine visit.  She is very hard of hearing and communication is difficult today. She is doing okay at home without any falls. She is still able to take care of her 2 dogs. She is not having any nausea or vomiting currently. She denies chest pain. She has not been dizzy. She is not having any low sugars. She has no complaints at today's visit. No shortness of breath. No cough or weight loss. Occasional sputum in the morning but no bloody or rusty colored sputum.  Review of Systems  Constitutional: Negative.   Respiratory: Negative.  Negative for cough, chest tightness, shortness of breath and wheezing.   Cardiovascular: Negative.  Negative for chest pain, palpitations and leg swelling.  Gastrointestinal: Negative.   Musculoskeletal: Negative.   Skin: Negative.   Neurological: Negative.   Psychiatric/Behavioral: Negative.        Objective:   Physical Exam  Constitutional: She is oriented to person, place, and time. She appears well-developed and well-nourished.  HENT:  Head: Normocephalic and atraumatic.  Eyes: EOM are normal. Pupils are equal, round, and reactive to light.  Neck: Normal range of motion. Neck supple.  Cardiovascular: Normal rate and regular rhythm.   Pulmonary/Chest: Effort normal and breath sounds normal.  Abdominal: Soft. Bowel sounds are normal.  Musculoskeletal: Normal range of motion.  Neurological: She is alert and oriented to person, place, and time.  Skin: Skin is warm and dry.  Psychiatric: She has a normal mood and affect. Her behavior is normal. Judgment and thought content normal.       Assessment/Plan:   1. Please see problem oriented charting.  2. Disposition- the patient be seen back in 6 months for followup. Checked HgA1c at today's visit. No changes to medicines, she does not wish to quit smoking.

## 2013-08-08 NOTE — Patient Instructions (Signed)
You are doing great and we are not going to change your medicines today.  Thank you for bringing your medicines today. This helps us keep you safe from mistakes.  Come back in about 6 months for a check up. You will meet your new doctor then as Dr. Lavonna MonarchKollar's last month here is June.  If you have any problems or questions please call our clinic at [phone].

## 2013-08-11 NOTE — Assessment & Plan Note (Signed)
Doing well at home without having to use her inhalers.

## 2013-08-11 NOTE — Assessment & Plan Note (Signed)
  Assessment: Progress toward smoking cessation:    Barriers to progress toward smoking cessation:    Comments: She does not wish to quit  Plan: Instruction/counseling given:  I counseled patient on the dangers of tobacco use, advised patient to stop smoking, and reviewed strategies to maximize success. Educational resources provided:  [employer](1-800-QUIT-NOW) brochure Self management tools provided:    Medications to assist with smoking cessation:  None Patient agreed to the following self-care plans for smoking cessation:    Other plans:

## 2013-08-11 NOTE — Progress Notes (Signed)
Case discussed with Dr. Kollar at the time of the visit.  We reviewed the resident's history and exam and pertinent patient test results.  I agree with the assessment, diagnosis, and plan of care documented in the resident's note.     

## 2013-08-11 NOTE — Assessment & Plan Note (Signed)
BP Readings from Last 3 Encounters:  08/08/13 134/53  03/14/13 140/61  12/27/12 138/65    Lab Results  Component Value Date   NA 136 08/13/2012   K 3.3* 08/13/2012   CREATININE 1.09 08/13/2012    Assessment: Blood pressure control: controlled Progress toward BP goal:  at goal Comments:   Plan: Medications:  continue current medications, HCTZ 25 mg daily, lopressor 100 mg BID Educational resources provided: brochure;handout;video Self management tools provided: instructions for home blood pressure monitoring Other plans:

## 2013-08-11 NOTE — Assessment & Plan Note (Signed)
Lab Results  Component Value Date   HGBA1C 6.7 08/08/2013   HGBA1C 6.2 03/14/2013   HGBA1C 6.3 08/05/2012     Assessment: Diabetes control: good control (HgbA1C at goal) Progress toward A1C goal:  at goal Comments: no hypoglycemia  Plan: Medications:  continue current medications, glipizide 5 mg BID Home glucose monitoring: Frequency: no home glucose monitoring Timing: N/A Instruction/counseling given: reminded to get eye exam, reminded to bring medications to each visit and discussed foot care Educational resources provided: brochure;handout Self management tools provided: instructions for home glucose monitoring Other plans: IF any hypoglycemia in the future would stop glipizide.

## 2013-08-15 ENCOUNTER — Encounter: Payer: Medicare Other | Admitting: Internal Medicine

## 2013-11-06 ENCOUNTER — Other Ambulatory Visit: Payer: Self-pay | Admitting: Internal Medicine

## 2013-11-26 ENCOUNTER — Other Ambulatory Visit: Payer: Self-pay | Admitting: Internal Medicine

## 2013-12-22 ENCOUNTER — Other Ambulatory Visit: Payer: Self-pay | Admitting: Internal Medicine

## 2013-12-31 ENCOUNTER — Other Ambulatory Visit: Payer: Self-pay | Admitting: *Deleted

## 2013-12-31 DIAGNOSIS — I10 Essential (primary) hypertension: Secondary | ICD-10-CM

## 2013-12-31 MED ORDER — HYDROCHLOROTHIAZIDE 25 MG PO TABS: 25.0000 mg | ORAL_TABLET | Freq: Every day | ORAL | Status: DC

## 2014-01-14 LAB — HM DIABETES EYE EXAM

## 2014-01-21 ENCOUNTER — Encounter: Payer: Self-pay | Admitting: *Deleted

## 2014-02-03 ENCOUNTER — Other Ambulatory Visit: Payer: Self-pay | Admitting: Internal Medicine

## 2014-05-01 ENCOUNTER — Other Ambulatory Visit: Payer: Self-pay | Admitting: Internal Medicine

## 2014-05-22 ENCOUNTER — Other Ambulatory Visit: Payer: Self-pay | Admitting: Internal Medicine

## 2014-08-02 ENCOUNTER — Other Ambulatory Visit: Payer: Self-pay | Admitting: Internal Medicine

## 2014-08-21 ENCOUNTER — Other Ambulatory Visit: Payer: Self-pay | Admitting: Internal Medicine

## 2014-08-21 DIAGNOSIS — I10 Essential (primary) hypertension: Secondary | ICD-10-CM

## 2014-08-21 NOTE — Telephone Encounter (Signed)
Message to front desk to schedule 

## 2014-08-21 NOTE — Telephone Encounter (Signed)
Please schedule Leah Kaufman for follow-up with PCP (Dr. Heide SparkNarendra) within the next 3 months.  She has not been seen in a year and is 6 months overdue for a follow-up appointment.  She needs reassessed for hypokalemia.  I prescribed a 3 month supply of HCTZ to get her to her next appointment with her PCP but she will need to be seen for further refills so we can be sure her hypokalemia has resolved or is being appropriately addressed.  Thank You.

## 2014-09-22 ENCOUNTER — Other Ambulatory Visit: Payer: Self-pay | Admitting: Internal Medicine

## 2014-10-07 ENCOUNTER — Telehealth: Payer: Self-pay | Admitting: Internal Medicine

## 2014-10-07 NOTE — Telephone Encounter (Signed)
Call to patient to confirm appointment for 10/08/14 at 9:45 lmtcb

## 2014-10-08 ENCOUNTER — Ambulatory Visit (INDEPENDENT_AMBULATORY_CARE_PROVIDER_SITE_OTHER): Payer: Medicare PPO | Admitting: Internal Medicine

## 2014-10-08 ENCOUNTER — Encounter: Payer: Self-pay | Admitting: Internal Medicine

## 2014-10-08 ENCOUNTER — Telehealth: Payer: Self-pay | Admitting: *Deleted

## 2014-10-08 VITALS — BP 159/58 | HR 74 | Temp 97.7°F | Ht 62.5 in | Wt 97.6 lb

## 2014-10-08 DIAGNOSIS — Z0000 Encounter for general adult medical examination without abnormal findings: Secondary | ICD-10-CM

## 2014-10-08 DIAGNOSIS — B351 Tinea unguium: Secondary | ICD-10-CM | POA: Diagnosis not present

## 2014-10-08 DIAGNOSIS — I10 Essential (primary) hypertension: Secondary | ICD-10-CM

## 2014-10-08 DIAGNOSIS — B3731 Acute candidiasis of vulva and vagina: Secondary | ICD-10-CM | POA: Insufficient documentation

## 2014-10-08 DIAGNOSIS — Z23 Encounter for immunization: Secondary | ICD-10-CM | POA: Diagnosis not present

## 2014-10-08 DIAGNOSIS — E119 Type 2 diabetes mellitus without complications: Secondary | ICD-10-CM

## 2014-10-08 DIAGNOSIS — B373 Candidiasis of vulva and vagina: Secondary | ICD-10-CM

## 2014-10-08 LAB — LIPID PANEL
CHOL/HDL RATIO: 2.8 ratio
Cholesterol: 142 mg/dL (ref 0–200)
HDL: 51 mg/dL (ref >=46)
LDL Cholesterol: 70 mg/dL (ref 0–99)
Triglycerides: 107 mg/dL (ref <150)
VLDL: 21 mg/dL (ref 0–40)

## 2014-10-08 LAB — COMPLETE METABOLIC PANEL WITH GFR
ALK PHOS: 75 U/L (ref 39–117)
ALT: <8 U/L (ref 0–35)
AST: 13 U/L (ref 0–37)
Albumin: 3.6 g/dL (ref 3.5–5.2)
BILIRUBIN TOTAL: 0.7 mg/dL (ref 0.2–1.2)
BUN: 18 mg/dL (ref 6–23)
CALCIUM: 10.3 mg/dL (ref 8.4–10.5)
CHLORIDE: 98 meq/L (ref 96–112)
CO2: 31 meq/L (ref 19–32)
CREATININE: 1.07 mg/dL (ref 0.50–1.10)
GFR, EST AFRICAN AMERICAN: 55 mL/min — AB
GFR, EST NON AFRICAN AMERICAN: 48 mL/min — AB
Glucose, Bld: 124 mg/dL — ABNORMAL HIGH (ref 70–99)
Potassium: 3.4 mEq/L — ABNORMAL LOW (ref 3.5–5.3)
SODIUM: 142 meq/L (ref 135–145)
TOTAL PROTEIN: 6.9 g/dL (ref 6.0–8.3)

## 2014-10-08 LAB — GLUCOSE, CAPILLARY: Glucose-Capillary: 131 mg/dL — ABNORMAL HIGH (ref 65–99)

## 2014-10-08 LAB — POCT GLYCOSYLATED HEMOGLOBIN (HGB A1C): Hemoglobin A1C: 6.5

## 2014-10-08 MED ORDER — FLUCONAZOLE 100 MG PO TABS: 100.0000 mg | ORAL_TABLET | Freq: Every day | ORAL | Status: DC

## 2014-10-08 NOTE — Assessment & Plan Note (Signed)
Lab Results  Component Value Date   HGBA1C 6.5 10/08/2014   HGBA1C 6.7 08/08/2013   HGBA1C 6.2 03/14/2013     Assessment: Diabetes control:  well controlled Progress toward A1C goal:   at goal Comments: compliant with meds  Plan: Medications:  continue current medications Home glucose monitoring: Frequency:   Timing:   Instruction/counseling given: discussed foot care and discussed diet Educational resources provided: brochure (Denies) Self management tools provided:   Other plans: Will check BMP, urine microalbumin, lipid profile.

## 2014-10-08 NOTE — Assessment & Plan Note (Signed)
Will refer to podiatry for further evaluation and treatment.  

## 2014-10-08 NOTE — Patient Instructions (Signed)
-   It was a pleasure seeing you today - You have a fungal infection in your feet and toenails and you need to follow up with podiatry (foot doctor) - I will give you a medication for your yeast infection - fluconazole. Take 1 pill only - continue with your BP medications for now - We will check blood work and a urine test today - we gave you a pneumonia shot today - Please follow up in 2-3 months  Onychomycosis/Fungal Toenails  WHAT IS IT? An infection that lies within the keratin of your nail plate that is caused by a fungus.  WHY ME? Fungal infections affect all ages, sexes, races, and creeds.  There may be many factors that predispose you to a fungal infection such as age, coexisting medical conditions such as diabetes, or an autoimmune disease; stress, medications, fatigue, genetics, etc.  Bottom line: fungus thrives in a warm, moist environment and your shoes offer such a location.  IS IT CONTAGIOUS? Theoretically, yes.  You do not want to share shoes, nail clippers or files with someone who has fungal toenails.  Walking around barefoot in the same room or sleeping in the same bed is unlikely to transfer the organism.  It is important to realize, however, that fungus can spread easily from one nail to the next on the same foot.  HOW DO WE TREAT THIS?  There are several ways to treat this condition.  Treatment may depend on many factors such as age, medications, pregnancy, liver and kidney conditions, etc.  It is best to ask your doctor which options are available to you.  1. No treatment.   Unlike many other medical concerns, you can live with this condition.  However for many people this can be a painful condition and may lead to ingrown toenails or a bacterial infection.  It is recommended that you keep the nails cut short to help reduce the amount of fungal nail. 2. Topical treatment.  These range from herbal remedies to prescription strength nail lacquers.  About 40-50% effective, topicals  require twice daily application for approximately 9 to 12 months or until an entirely new nail has grown out.  The most effective topicals are medical grade medications available through physicians offices. 3. Oral antifungal medications.  With an 80-90% cure rate, the most common oral medication requires 3 to 4 months of therapy and stays in your system for a year as the new nail grows out.  Oral antifungal medications do require blood work to make sure it is a safe drug for you.  A liver function panel will be performed prior to starting the medication and after the first month of treatment.  It is important to have the blood work performed to avoid any harmful side effects.  In general, this medication safe but blood work is required. 4. Laser Therapy.  This treatment is performed by applying a specialized laser to the affected nail plate.  This therapy is noninvasive, fast, and non-painful.  It is not covered by insurance and is therefore, out of pocket.  The results have been very good with a 80-95% cure rate.  The [facility] is the only practice in the area to offer this therapy. 5. Permanent Nail Avulsion.  Removing the entire nail so that a new nail will not grow back.

## 2014-10-08 NOTE — Assessment & Plan Note (Addendum)
-   Will give prevnar 13 today - Will discuss the shingles vaccine and DEXA scan with her on next follow up

## 2014-10-08 NOTE — Assessment & Plan Note (Signed)
-   Patient refuses exam of her groin today - Will treat with diflucan * 1 dose and reassess on follow up

## 2014-10-08 NOTE — Assessment & Plan Note (Signed)
BP Readings from Last 3 Encounters:  10/08/14 159/58  08/08/13 134/53  03/14/13 140/61    Lab Results  Component Value Date   NA 136 08/13/2012   K 3.3* 08/13/2012   CREATININE 1.09 08/13/2012    Assessment: Blood pressure control:  fair control Progress toward BP goal:   deteriorated Comments: patient denies taking her meds this AM  Plan: Medications:  continue current medications Educational resources provided:   Self management tools provided:   Other plans:

## 2014-10-08 NOTE — Progress Notes (Signed)
   Subjective:    Patient ID: Leah Kaufman, female    DOB: January 20, 1929, 81 y.o.   MRN: [id number]  HPI Patient seen and examined. She is here for routine follow up of her diabetes and HTN.  Of note she complains of itching in her groin area that she attributes to a yeast infection. No fevers, no dysuria, no increased urinary frequency.  She also states that she has been having difficulty cutting her toe nails but denies fevers or redness in her feet.   Review of Systems  Constitutional: Negative.   HENT: Negative.   Eyes: Negative.   Respiratory: Negative.   Cardiovascular: Negative.   Gastrointestinal: Negative.   Genitourinary: Negative for dysuria, frequency, hematuria, flank pain, vaginal bleeding, vaginal discharge, difficulty urinating and genital sores.       Complains of itching secondary to a yeats infection in her groin  Musculoskeletal: Negative.   Skin: Negative.   Neurological: Negative.   Psychiatric/Behavioral: Negative.        Objective:   Physical Exam  Constitutional: She is oriented to person, place, and time. She appears well-developed and well-nourished.  HENT:  Head: Normocephalic and atraumatic.  Eyes: Conjunctivae are normal.  Neck: Normal range of motion. Neck supple.  Cardiovascular: Normal rate, regular rhythm and normal heart sounds.   Pulmonary/Chest: Breath sounds normal. No respiratory distress. She has no wheezes.  Abdominal: Soft. Bowel sounds are normal. She exhibits no distension. There is no tenderness. There is no rebound.  Genitourinary:  Patient refuses genital exam at this time despite complaining of itching in the area  Musculoskeletal: Normal range of motion. She exhibits no edema.  Neurological: She is alert and oriented to person, place, and time.  Skin:  Patient with erythema over the sole of both feet and onychomycosis b/l toe nails   Psychiatric: She has a normal mood and affect. Her behavior is normal.            Assessment & Plan:  Please see problem based charting for assessment and plan:

## 2014-10-08 NOTE — Telephone Encounter (Signed)
Spoke with patient's Grandson about the importance of taking patient to Podiatry appointment.  Lucila MaineGrandson said that he will take the patient. Needs at least 2 weeks notice.  Grandson informed that patient will be given a number to call the Podiatrist office to schedule an appointment that is more convenient if needed.  Angelina OkGladys Hellena Pridgen, RN 10/08/2014 11:45AM

## 2014-10-09 LAB — MICROALBUMIN / CREATININE URINE RATIO
CREATININE, URINE: 139.8 mg/dL
Microalb Creat Ratio: 88.7 mg/g — ABNORMAL HIGH (ref 0.0–30.0)
Microalb, Ur: 12.4 mg/dL — ABNORMAL HIGH (ref <2.0)

## 2014-10-30 ENCOUNTER — Other Ambulatory Visit: Payer: Self-pay | Admitting: Internal Medicine

## 2014-11-19 ENCOUNTER — Other Ambulatory Visit: Payer: Self-pay | Admitting: Internal Medicine

## 2014-12-18 IMAGING — CR DG LUMBAR SPINE COMPLETE 4+V
5 series · 5 of 5 positions shown · non-contrast
Comparison: None.

CLINICAL DATA: Low back pain that radiates down to the left knee.
No injury.

LUMBAR SPINE - COMPLETE 4+ VIEW

[t l-spine a.p.]
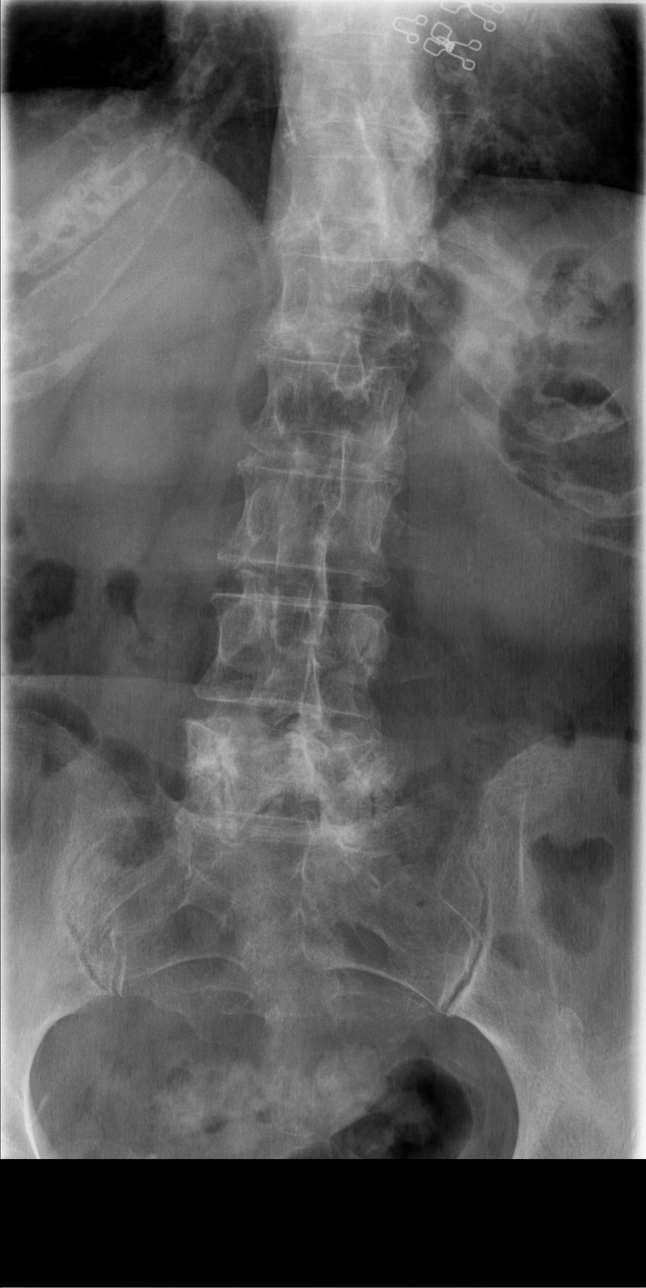

[t l-spine oblique exposure (1 of 2)]
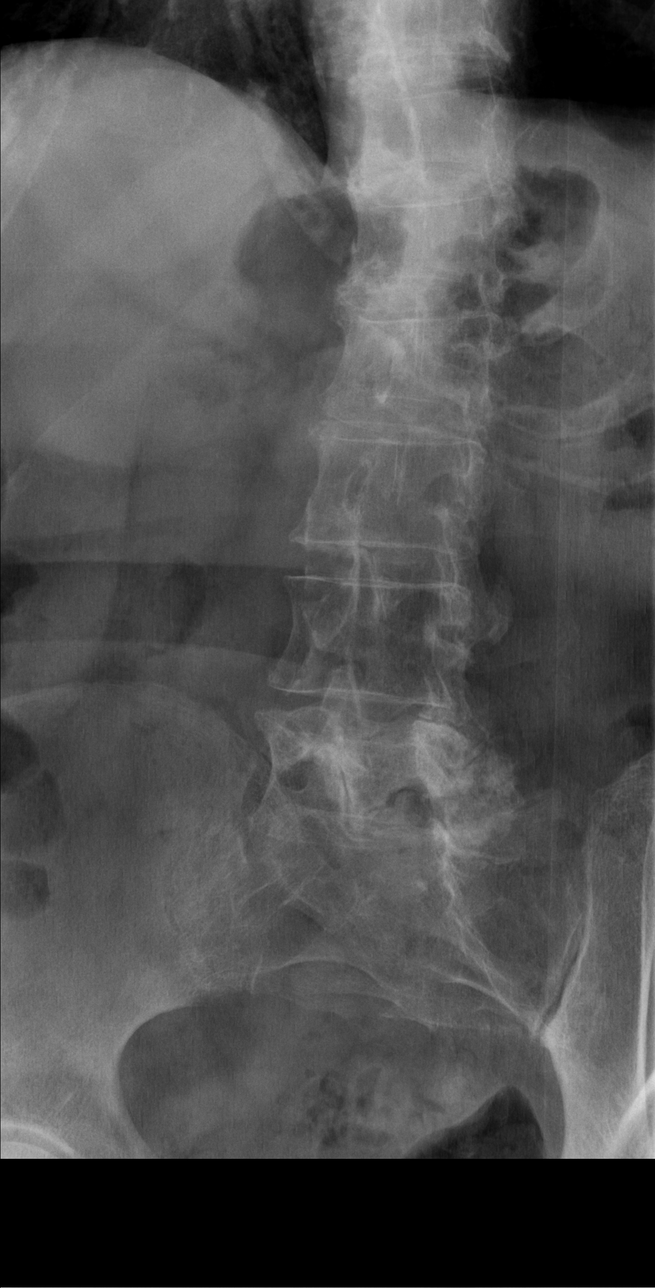

[t l-spine oblique exposure (2 of 2)]
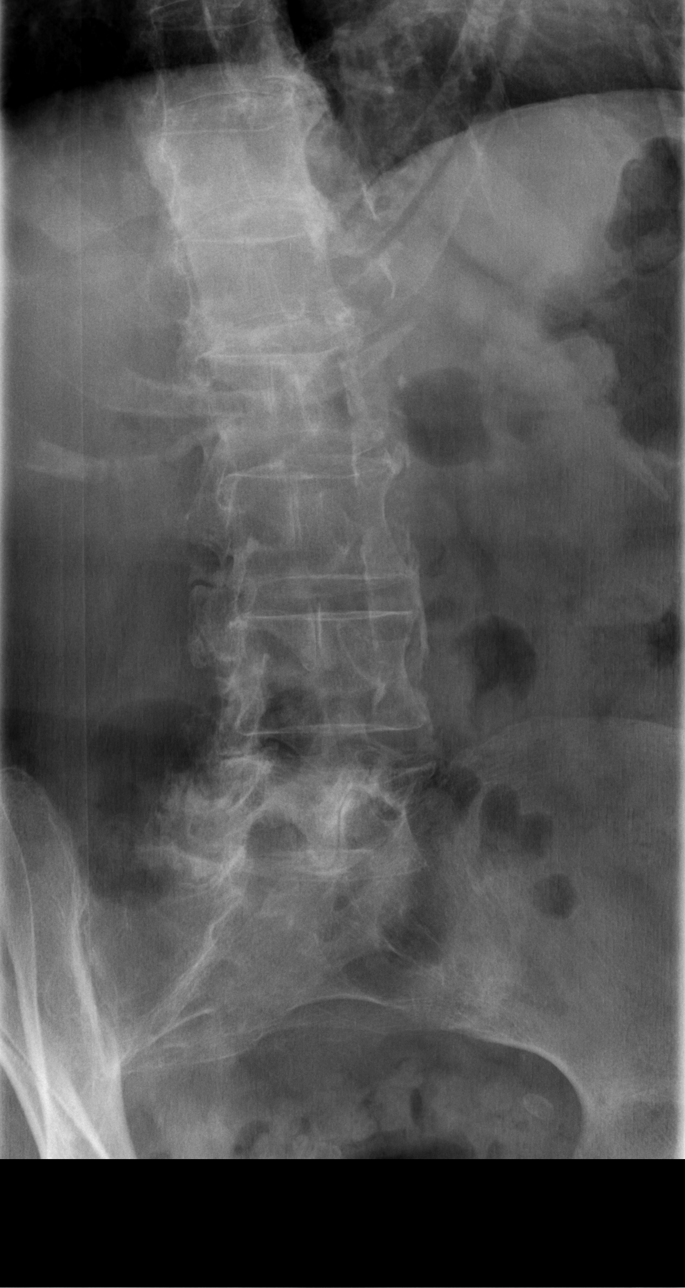

[t l-spine lat]
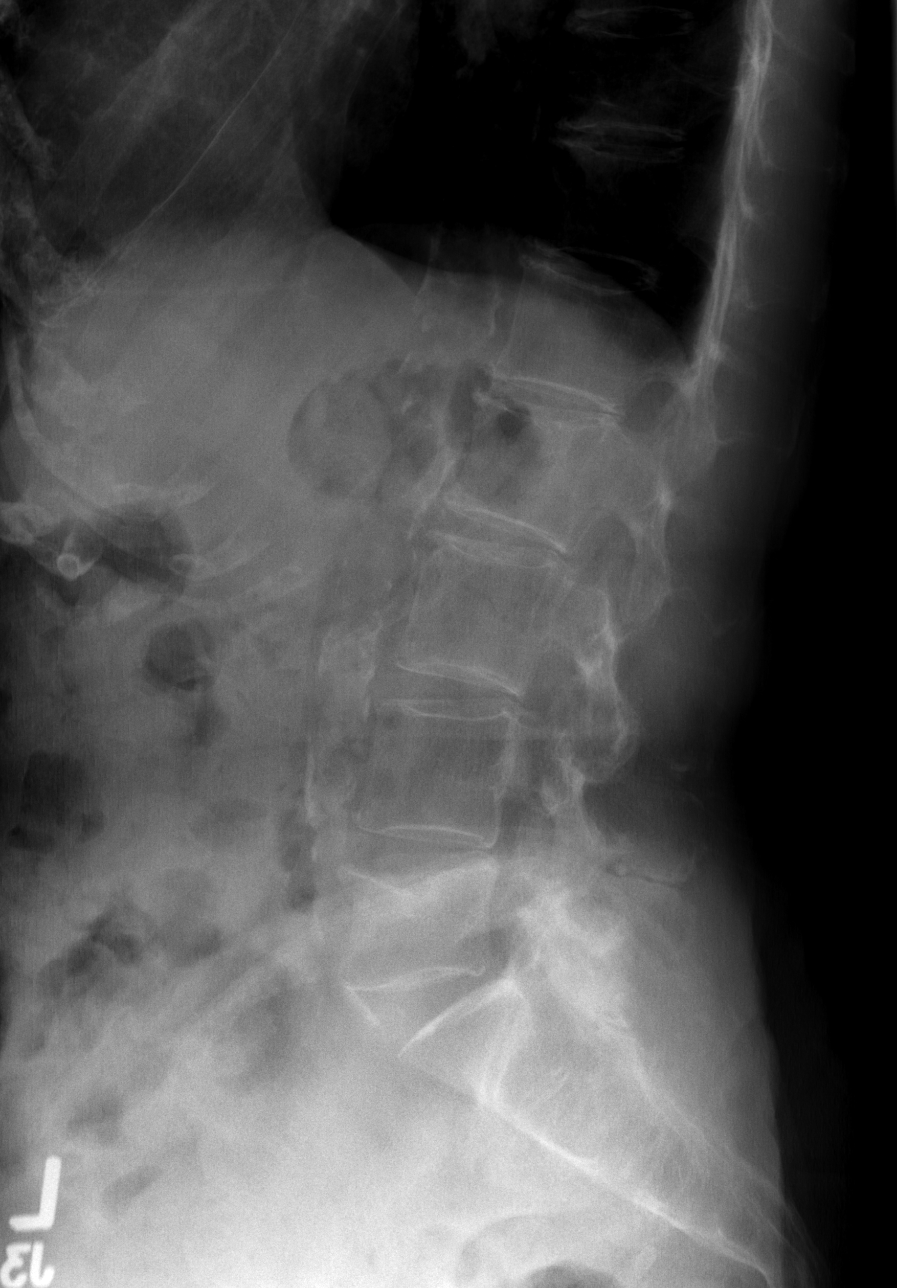

[t l-spine l5-s1 spot]
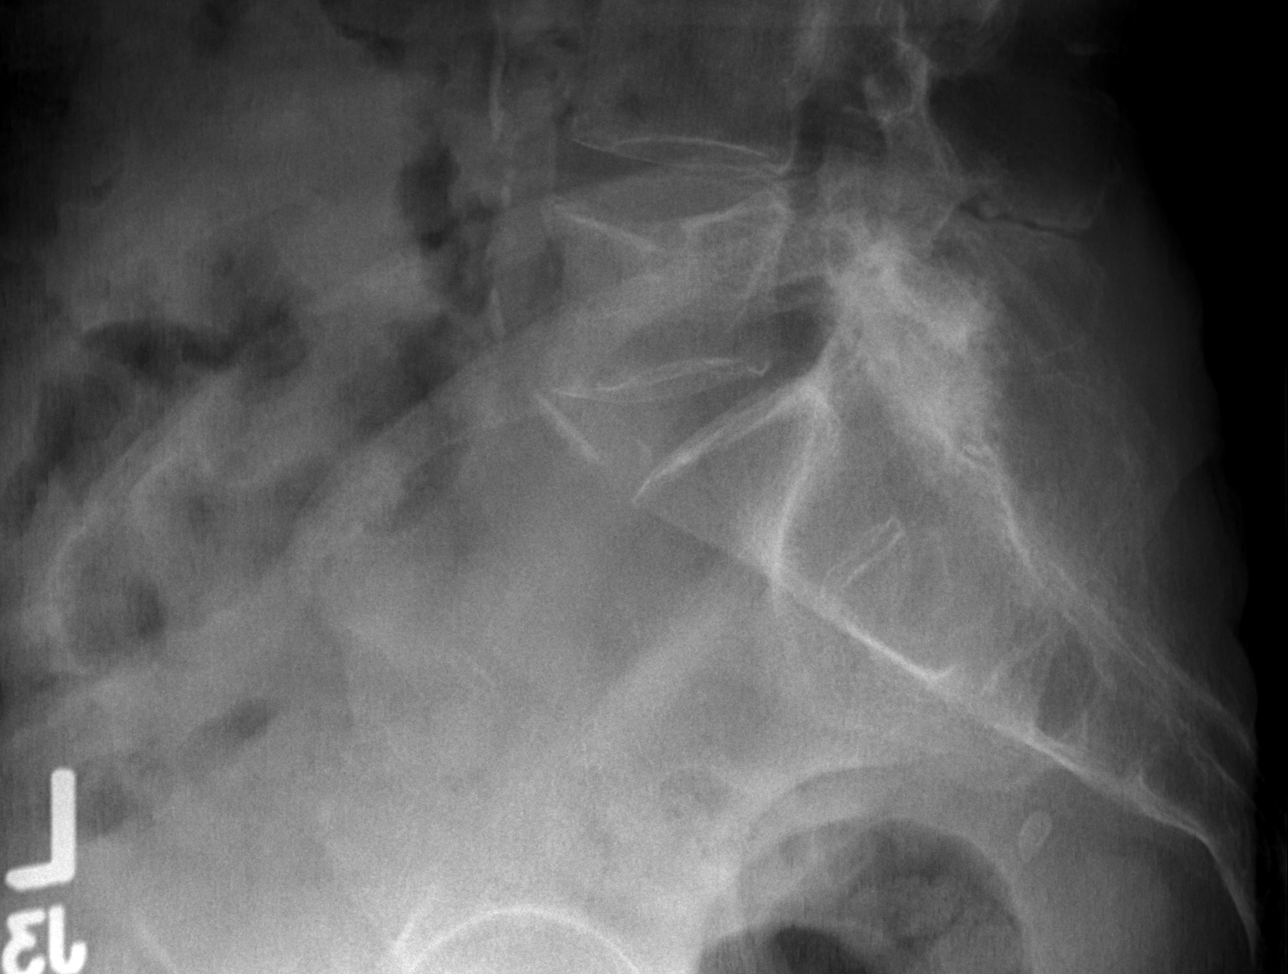

[5 of 5 positions shown; findings below may reference images not displayed]

FINDINGS: There is straightening of the normal lumbar lordosis.
There is mild depression of the central upper endplate of L5 with
associated upper endplate sclerosis.  This may reflect a mild
remote fracture or be from a Schmorl's node.  All remaining lumbar
vertebrae are normal in height.  There is a grade 1 anterolisthesis
of L5 on S1.  No other spondylolisthesis.  There is mild loss of
disc height from the lower thoracic spine through L4-L5.  Facet
joint narrowing is noted throughout the lumbar spine but most
evident bilaterally at L5-S1 where there is associated sclerosis.
The bones are diffusely demineralized.  The surrounding soft
tissues are unremarkable.
IMPRESSION: No acute findings.  Mild depression of the upper endplate of L[DATE]
reflect an old mild fracture but is more likely degenerative due to
Schmorl's node.

Degenerative changes as described.

## 2015-01-12 ENCOUNTER — Encounter: Payer: Self-pay | Admitting: Internal Medicine

## 2015-01-12 ENCOUNTER — Ambulatory Visit (INDEPENDENT_AMBULATORY_CARE_PROVIDER_SITE_OTHER): Payer: Medicare PPO | Admitting: Internal Medicine

## 2015-01-12 DIAGNOSIS — E119 Type 2 diabetes mellitus without complications: Secondary | ICD-10-CM | POA: Diagnosis not present

## 2015-01-12 DIAGNOSIS — Z7984 Long term (current) use of oral hypoglycemic drugs: Secondary | ICD-10-CM | POA: Diagnosis not present

## 2015-01-12 DIAGNOSIS — B351 Tinea unguium: Secondary | ICD-10-CM

## 2015-01-12 DIAGNOSIS — Z23 Encounter for immunization: Secondary | ICD-10-CM

## 2015-01-12 DIAGNOSIS — B373 Candidiasis of vulva and vagina: Secondary | ICD-10-CM

## 2015-01-12 DIAGNOSIS — I10 Essential (primary) hypertension: Secondary | ICD-10-CM

## 2015-01-12 DIAGNOSIS — Z0000 Encounter for general adult medical examination without abnormal findings: Secondary | ICD-10-CM

## 2015-01-12 DIAGNOSIS — B3731 Acute candidiasis of vulva and vagina: Secondary | ICD-10-CM

## 2015-01-12 LAB — GLUCOSE, CAPILLARY: GLUCOSE-CAPILLARY: 142 mg/dL — AB (ref 65–99)

## 2015-01-12 MED ORDER — FLUCONAZOLE 150 MG PO TABS: 150.0000 mg | ORAL_TABLET | Freq: Every day | ORAL | Status: DC

## 2015-01-12 NOTE — Progress Notes (Signed)
Call to Dr. Harriet Pho appointment scheduled for patient on 02/10/2015 at 11:30 am patient to arrive by 11:00 AM. Pt was given address [address] [facility] and phone number [phone] to call if unable to keep appointment or if she can schedule to go earlier.  Patient voiced understanding of the plan.  Angelina Ok, RN 01/12/2015 11:56 AM

## 2015-01-12 NOTE — Assessment & Plan Note (Signed)
Flu shot given today

## 2015-01-12 NOTE — Patient Instructions (Signed)
-   It was a pleasure seeing you today - You need to follow up with the podiatrist for your foot care. We will set up an appointment for you - I have prescribed fluconazole for your yeast infection. Do not take your cholesterol medication while on this - If it does not clear up you will need a pelvic exam on your next appointment - We will give you a flu shot today

## 2015-01-12 NOTE — Assessment & Plan Note (Signed)
-   Patient to f/u with podiatry on 11/2 - Will f/u consult

## 2015-01-12 NOTE — Assessment & Plan Note (Signed)
-   patient refuses vaginal exam - Will give fluconazole * 3 doses - If no improvement on f/u patient is agreeable to exam

## 2015-01-12 NOTE — Assessment & Plan Note (Signed)
BP Readings from Last 3 Encounters:  01/12/15 137/56  10/08/14 159/58  08/08/13 134/53    Lab Results  Component Value Date   NA 142 10/08/2014   K 3.4* 10/08/2014   CREATININE 1.07 10/08/2014    Assessment: Blood pressure control:  well controlled Progress toward BP goal:   at goal Comments: compliant with meds  Plan: Medications:  continue current medications Educational resources provided:   Self management tools provided:   Other plans:

## 2015-01-12 NOTE — Progress Notes (Signed)
   Subjective:    Patient ID: Leah Kaufman, female    DOB: 1928/09/29, 81 y.o.   MRN: [id number]  HPI Patient seen and examined. She presents today for follow up of her HTN and DM. When she was here on her last visit she was referred to podiatry for foot care including toe nail care given her onychomycosis. She has yet to follow up with them. She also complains of vaginal itching which she attributes to a fungal infection. She states that she gets them frequently and she normally gets 3 doses of fluconazole and 1 dose which she got on her last visit did not resolve the infection. She was offered a pelvic exam but refuses it at this time.   Review of Systems  Constitutional: Negative.   HENT: Negative.   Eyes: Negative.   Respiratory: Negative.   Cardiovascular: Negative.   Gastrointestinal: Negative.   Endocrine: Negative.   Genitourinary:       Denies vaginal discharge. She does complain of vaginal itching  Musculoskeletal: Negative.   Skin: Negative.   Neurological: Negative.   Psychiatric/Behavioral: Negative.        Objective:   Physical Exam  Constitutional: She appears well-developed and well-nourished.  HENT:  Head: Normocephalic and atraumatic.  Neck: Normal range of motion.  Cardiovascular: Normal rate, regular rhythm and normal heart sounds.   Pulmonary/Chest: Effort normal and breath sounds normal. No respiratory distress. She has no wheezes.  Abdominal: Soft. Bowel sounds are normal. She exhibits no distension. There is no tenderness.  Genitourinary:  Patient refuses vaginal exam  Musculoskeletal: Normal range of motion. She exhibits no edema or tenderness.  Skin: There is erythema.  Onychomycosis + Erythema over sole of feet  Psychiatric: She has a normal mood and affect. Her behavior is normal.          Assessment & Plan:  Please see problem based charting for assessment and plan:

## 2015-01-12 NOTE — Assessment & Plan Note (Addendum)
-   Blood sugars are well controlled.  - Will get repeat A1C on next visit - continue with glipizide for now - Patient to f/u with optho for eye exam

## 2015-01-27 ENCOUNTER — Other Ambulatory Visit: Payer: Self-pay | Admitting: Internal Medicine

## 2015-01-27 LAB — HM DIABETES EYE EXAM

## 2015-02-12 ENCOUNTER — Encounter: Payer: Self-pay | Admitting: *Deleted

## 2015-04-24 ENCOUNTER — Other Ambulatory Visit: Payer: Self-pay | Admitting: Internal Medicine

## 2015-05-14 ENCOUNTER — Other Ambulatory Visit: Payer: Self-pay | Admitting: Student in an Organized Health Care Education/Training Program

## 2015-05-25 ENCOUNTER — Ambulatory Visit (INDEPENDENT_AMBULATORY_CARE_PROVIDER_SITE_OTHER): Payer: Medicare PPO | Admitting: Internal Medicine

## 2015-05-25 ENCOUNTER — Encounter: Payer: Self-pay | Admitting: Internal Medicine

## 2015-05-25 VITALS — BP 182/61 | HR 60 | Temp 97.5°F | Ht 62.5 in | Wt 94.6 lb

## 2015-05-25 DIAGNOSIS — E119 Type 2 diabetes mellitus without complications: Secondary | ICD-10-CM | POA: Diagnosis not present

## 2015-05-25 DIAGNOSIS — R1032 Left lower quadrant pain: Secondary | ICD-10-CM | POA: Diagnosis not present

## 2015-05-25 DIAGNOSIS — B3731 Acute candidiasis of vulva and vagina: Secondary | ICD-10-CM

## 2015-05-25 DIAGNOSIS — Z87440 Personal history of urinary (tract) infections: Secondary | ICD-10-CM

## 2015-05-25 DIAGNOSIS — R8271 Bacteriuria: Secondary | ICD-10-CM | POA: Diagnosis not present

## 2015-05-25 DIAGNOSIS — Z8742 Personal history of other diseases of the female genital tract: Secondary | ICD-10-CM

## 2015-05-25 DIAGNOSIS — B351 Tinea unguium: Secondary | ICD-10-CM | POA: Diagnosis not present

## 2015-05-25 DIAGNOSIS — I10 Essential (primary) hypertension: Secondary | ICD-10-CM

## 2015-05-25 DIAGNOSIS — N390 Urinary tract infection, site not specified: Secondary | ICD-10-CM | POA: Insufficient documentation

## 2015-05-25 DIAGNOSIS — Z09 Encounter for follow-up examination after completed treatment for conditions other than malignant neoplasm: Secondary | ICD-10-CM

## 2015-05-25 DIAGNOSIS — R300 Dysuria: Secondary | ICD-10-CM

## 2015-05-25 DIAGNOSIS — B373 Candidiasis of vulva and vagina: Secondary | ICD-10-CM

## 2015-05-25 LAB — POCT URINALYSIS DIPSTICK
BILIRUBIN UA: NEGATIVE
GLUCOSE UA: NEGATIVE
KETONES UA: NEGATIVE
SPEC GRAV UA: 1.02
UROBILINOGEN UA: 1
pH, UA: 7

## 2015-05-25 LAB — GLUCOSE, CAPILLARY: Glucose-Capillary: 140 mg/dL — ABNORMAL HIGH (ref 65–99)

## 2015-05-25 LAB — POCT GLYCOSYLATED HEMOGLOBIN (HGB A1C): Hemoglobin A1C: 7.2

## 2015-05-25 MED ORDER — LISINOPRIL 2.5 MG PO TABS: 2.5000 mg | ORAL_TABLET | Freq: Every day | ORAL | Status: DC

## 2015-05-25 MED ORDER — SULFAMETHOXAZOLE-TRIMETHOPRIM 400-80 MG PO TABS: 1.0000 | ORAL_TABLET | Freq: Two times a day (BID) | ORAL | Status: DC

## 2015-05-25 MED ORDER — SULFAMETHOXAZOLE-TRIMETHOPRIM 800-160 MG PO TABS: 1.0000 | ORAL_TABLET | Freq: Two times a day (BID) | ORAL | Status: DC

## 2015-05-25 NOTE — Assessment & Plan Note (Signed)
-   Patient with mild left flank pain which she states is consistent with prior UTIs - Urine was foul smelling and cloudy - Urine dip noted - trace blood, trace leuks, positive nitrite - Will treat with 5 day course of Bactrim - f/u urine culture

## 2015-05-25 NOTE — Assessment & Plan Note (Signed)
-   Patient to follow up with podiatrist - Had toe nails trimmed - Will reassess on next visit

## 2015-05-25 NOTE — Assessment & Plan Note (Signed)
BP Readings from Last 3 Encounters:  05/25/15 182/61  01/12/15 137/56  10/08/14 159/58    Lab Results  Component Value Date   NA 142 10/08/2014   K 3.4* 10/08/2014   CREATININE 1.07 10/08/2014    Assessment: Blood pressure control:  poorly controlled Progress toward BP goal:   deteriorated Comments: patient states she is compliant with meds  Plan: Medications:  Will c/w HCTZ and add lisinopril 2.5 mg Educational resources provided: brochure (denies) Self management tools provided:   Other plans: Will reassess in 1 month and check BMP. Will likely change to combo pill if we need to increase lisinopril

## 2015-05-25 NOTE — Assessment & Plan Note (Signed)
-   symptoms now resolved - No further w/u for now

## 2015-05-25 NOTE — Patient Instructions (Signed)
-   It was a pleasure seeing you today - We will start you on an antibiotic for your urinary tract infection - We will check your bloodwork on her next visit - please follow up in 4 weeks

## 2015-05-25 NOTE — Progress Notes (Addendum)
   Subjective:    Patient ID: Leah Kaufman, female    DOB: [birth year], 82 y.o.   MRN: [id number]  HPI Patient seen and examined. She is here for follow up of her HTN and DM. States she is compliant with her meds today but has had been inconsistent with meds over the last 2 weeks because she has been feeling "sick". States she now feels much better and has started taking her meds again over the last 2 days  Of note, she also complains of pain over her left flank which she states she gets every time she has a UTI. She denies fevers/dysuria/increased urinary frequency. No hematuria.  She was also treated for possible vaginal candidiasis on last visit and states that the pruritis and discharge have now resolved.  She also followed up with a podiatrist for her onychomycosis and had her toe nails trimmed   Review of Systems  Constitutional: Negative.   HENT: Negative.   Respiratory: Negative.   Cardiovascular: Negative.   Gastrointestinal: Negative.   Genitourinary: Positive for flank pain. Negative for dysuria, urgency, frequency, hematuria and difficulty urinating.  Musculoskeletal: Negative.   Skin: Negative.   Neurological: Negative.   Psychiatric/Behavioral: Negative.        Objective:   Physical Exam  Constitutional: She is oriented to person, place, and time. She appears well-developed and well-nourished.  HENT:  Head: Normocephalic and atraumatic.  Cardiovascular: Normal rate, regular rhythm and normal heart sounds.   Pulmonary/Chest: Effort normal and breath sounds normal. No respiratory distress. She has no wheezes.  Abdominal: Soft. Bowel sounds are normal. She exhibits no distension. There is no tenderness.  Musculoskeletal: Normal range of motion. She exhibits no edema or tenderness.  Neurological: She is alert and oriented to person, place, and time.  Skin: Skin is warm.  Mild erythema with peeling skin noted on soles of feet. Much improved compared to last visit.  Toe nails now trimmed.   Psychiatric: She has a normal mood and affect. Her behavior is normal.          Assessment & Plan:  Please see problem based charting for assessment and plan:

## 2015-05-25 NOTE — Assessment & Plan Note (Signed)
Lab Results  Component Value Date   HGBA1C 7.2 05/25/2015   HGBA1C 6.5 10/08/2014   HGBA1C 6.7 08/08/2013     Assessment: Diabetes control:  well controlled Progress toward A1C goal:   deteriorated Comments: patient states she just started taking her meds again a couple of days ago and missed about a week or 2 of meds  Plan: Medications:  continue current medications Home glucose monitoring: Frequency:   Timing:   Instruction/counseling given: reminded to bring blood glucose meter & log to each visit and discussed foot care Educational resources provided: brochure (denies) Self management tools provided:   Other plans: check BMP on next visit

## 2015-05-26 ENCOUNTER — Telehealth: Payer: Self-pay | Admitting: Internal Medicine

## 2015-05-26 NOTE — Telephone Encounter (Signed)
Patient needs some clarification about if she needs to double up on some medication.

## 2015-05-26 NOTE — Telephone Encounter (Signed)
Thank you Leigh...  

## 2015-05-26 NOTE — Telephone Encounter (Signed)
Spoke with patient, she wanted to clarify the dosage on her simvastatin and metoprolol.  She now understands she is to take one metoprolol each morning, and one each night.  And one simvastatin each night.  Any additional questions she will call.

## 2015-05-27 ENCOUNTER — Telehealth: Payer: Self-pay | Admitting: Internal Medicine

## 2015-05-27 LAB — URINE CULTURE

## 2015-05-27 MED ORDER — AMOXICILLIN-POT CLAVULANATE 875-125 MG PO TABS: 1.0000 | ORAL_TABLET | Freq: Two times a day (BID) | ORAL | Status: AC

## 2015-05-27 NOTE — Telephone Encounter (Signed)
Urine culture growing E. Coli resistant to bactrim. Called patient and informed her of results of test and asked her to stop taking bactrim. I have put in a prescription for augmentin at her pharmacy. She states she feels well and will go and pick up the new prescription today and complete course of antibiotics

## 2015-06-19 ENCOUNTER — Other Ambulatory Visit: Payer: Self-pay | Admitting: Internal Medicine

## 2015-06-21 ENCOUNTER — Other Ambulatory Visit: Payer: Self-pay | Admitting: Internal Medicine

## 2015-07-12 ENCOUNTER — Telehealth: Payer: Self-pay | Admitting: Internal Medicine

## 2015-07-12 NOTE — Telephone Encounter (Signed)
APPT. REMINDER, NO ANSWER, NO VOICEMAIL

## 2015-07-13 ENCOUNTER — Encounter: Payer: Self-pay | Admitting: Internal Medicine

## 2015-07-13 ENCOUNTER — Ambulatory Visit (INDEPENDENT_AMBULATORY_CARE_PROVIDER_SITE_OTHER): Payer: Medicare PPO | Admitting: Internal Medicine

## 2015-07-13 VITALS — BP 142/52 | HR 62 | Temp 97.6°F | Ht 62.5 in | Wt 93.5 lb

## 2015-07-13 DIAGNOSIS — I10 Essential (primary) hypertension: Secondary | ICD-10-CM

## 2015-07-13 DIAGNOSIS — B353 Tinea pedis: Secondary | ICD-10-CM | POA: Diagnosis not present

## 2015-07-13 DIAGNOSIS — J449 Chronic obstructive pulmonary disease, unspecified: Secondary | ICD-10-CM

## 2015-07-13 DIAGNOSIS — E119 Type 2 diabetes mellitus without complications: Secondary | ICD-10-CM | POA: Diagnosis not present

## 2015-07-13 LAB — GLUCOSE, CAPILLARY: GLUCOSE-CAPILLARY: 107 mg/dL — AB (ref 65–99)

## 2015-07-13 MED ORDER — ALBUTEROL SULFATE HFA 108 (90 BASE) MCG/ACT IN AERS: 2.0000 | INHALATION_SPRAY | RESPIRATORY_TRACT | Status: AC | PRN

## 2015-07-13 MED ORDER — TERBINAFINE HCL 1 % EX CREA: 1.0000 "application " | TOPICAL_CREAM | Freq: Two times a day (BID) | CUTANEOUS | Status: DC

## 2015-07-13 MED ORDER — LISINOPRIL 2.5 MG PO TABS: ORAL_TABLET | ORAL | Status: DC

## 2015-07-13 NOTE — Assessment & Plan Note (Signed)
-   Patient with b/l scattered wheezes and intermittent episodes of SOB - She is a current smoker and is not interested in quitting - Will prescribe albuterol inhaler for patient to use prn - Would consider checking PFTs on next visit

## 2015-07-13 NOTE — Assessment & Plan Note (Addendum)
BP Readings from Last 3 Encounters:  07/13/15 142/52  05/25/15 182/61  01/12/15 137/56    Lab Results  Component Value Date   NA 142 10/08/2014   K 3.4* 10/08/2014   CREATININE 1.07 10/08/2014    Assessment: Blood pressure control:  well controlled Progress toward BP goal:   at goal Comments: Patient is compliant with lisinopril, metoprolol and HCTZ  Plan: Medications:  continue current medications Educational resources provided: brochure (denies) Self management tools provided:   Other plans: will check BMP today

## 2015-07-13 NOTE — Patient Instructions (Signed)
-   It was a pleasure seeing you today - Your HTN is doing much better - We will continue with your current medications for your diabetes - Please follow up in 3 months - Will prescribe an anti fungal cream for your feet - please apply it 2 times a day (morinig and night) - If you develop a worsening cough please let us know - Will check your bloodwork today

## 2015-07-13 NOTE — Progress Notes (Signed)
   Subjective:    Patient ID: Leah Kaufman, female    DOB: 1929/01/20, 82 y.o.   MRN: [id number]  HPI Patient seen and examined. She is here for routine follow up of her HTN and DM. She doesn't have any new complaints She does have a chronic cough usually dry but occasionally productive of clear sputum. No hemoptysis. No change in cough per patient. No fevers. States yeast infection has resolved and she has no urinary symptoms.   Review of Systems  Constitutional: Negative.   HENT: Negative.   Eyes: Negative.   Respiratory: Positive for cough and wheezing. Negative for shortness of breath.   Cardiovascular: Negative.   Gastrointestinal: Negative.   Musculoskeletal: Negative.   Skin: Negative.   Neurological: Negative.   Psychiatric/Behavioral: Negative.        Objective:   Physical Exam  Constitutional: She is oriented to person, place, and time. She appears well-developed and well-nourished.  HENT:  Head: Normocephalic and atraumatic.  Cardiovascular: Normal rate, regular rhythm and normal heart sounds.   Pulmonary/Chest: Effort normal. No respiratory distress. She has wheezes.  b/l scattered wheezes +  Abdominal: Soft. Bowel sounds are normal. She exhibits no distension. There is no tenderness.  Musculoskeletal: Normal range of motion. She exhibits edema. She exhibits no tenderness.  Trace b/l edema +  Neurological: She is alert and oriented to person, place, and time.  Skin: Skin is warm and dry.  B/l erythema and scaling over soles of feet  Psychiatric: She has a normal mood and affect. Her behavior is normal.          Assessment & Plan:  Please see problem based charting for assessment and plan:

## 2015-07-13 NOTE — Assessment & Plan Note (Addendum)
-   Has persistent erythema and scaling of soles of feet - No ulcers noted - Will start patient on terbinafine cream bid and f/u - Will give 2-4 week course

## 2015-07-13 NOTE — Assessment & Plan Note (Signed)
-   Blood sugars are well controlled - No hypoglycemic episodes noted - Patient to c/w glipizide - Will check BMP today

## 2015-07-14 LAB — BMP8+ANION GAP
Anion Gap: 16 mmol/L (ref 10.0–18.0)
BUN/Creatinine Ratio: 22 (ref 12–28)
BUN: 23 mg/dL (ref 8–27)
CALCIUM: 10.5 mg/dL — AB (ref 8.7–10.3)
CHLORIDE: 96 mmol/L (ref 96–106)
CO2: 29 mmol/L (ref 18–29)
CREATININE: 1.03 mg/dL — AB (ref 0.57–1.00)
GFR calc Af Amer: 58 mL/min/{1.73_m2} — ABNORMAL LOW (ref >59)
GFR calc non Af Amer: 50 mL/min/{1.73_m2} — ABNORMAL LOW (ref >59)
GLUCOSE: 142 mg/dL — AB (ref 65–99)
Potassium: 3.8 mmol/L (ref 3.5–5.2)
Sodium: 141 mmol/L (ref 134–144)

## 2015-07-29 ENCOUNTER — Other Ambulatory Visit: Payer: Self-pay | Admitting: Internal Medicine

## 2015-10-12 ENCOUNTER — Other Ambulatory Visit: Payer: Self-pay | Admitting: Internal Medicine

## 2015-11-09 ENCOUNTER — Encounter: Payer: Self-pay | Admitting: Internal Medicine

## 2015-11-09 ENCOUNTER — Encounter (INDEPENDENT_AMBULATORY_CARE_PROVIDER_SITE_OTHER): Payer: Self-pay

## 2015-11-09 ENCOUNTER — Ambulatory Visit (INDEPENDENT_AMBULATORY_CARE_PROVIDER_SITE_OTHER): Payer: Medicare PPO | Admitting: Internal Medicine

## 2015-11-09 VITALS — BP 168/53 | HR 53 | Temp 97.5°F | Ht 62.5 in | Wt 86.9 lb

## 2015-11-09 DIAGNOSIS — I10 Essential (primary) hypertension: Secondary | ICD-10-CM | POA: Diagnosis not present

## 2015-11-09 DIAGNOSIS — B351 Tinea unguium: Secondary | ICD-10-CM

## 2015-11-09 DIAGNOSIS — Z79899 Other long term (current) drug therapy: Secondary | ICD-10-CM

## 2015-11-09 DIAGNOSIS — B353 Tinea pedis: Secondary | ICD-10-CM

## 2015-11-09 DIAGNOSIS — E119 Type 2 diabetes mellitus without complications: Secondary | ICD-10-CM

## 2015-11-09 DIAGNOSIS — Z7984 Long term (current) use of oral hypoglycemic drugs: Secondary | ICD-10-CM

## 2015-11-09 LAB — GLUCOSE, CAPILLARY: Glucose-Capillary: 148 mg/dL — ABNORMAL HIGH (ref 65–99)

## 2015-11-09 LAB — POCT GLYCOSYLATED HEMOGLOBIN (HGB A1C): Hemoglobin A1C: 6.6

## 2015-11-09 MED ORDER — LISINOPRIL 5 MG PO TABS: 5.0000 mg | ORAL_TABLET | Freq: Every day | ORAL | 0 refills | Status: DC

## 2015-11-09 NOTE — Assessment & Plan Note (Signed)
BP Readings from Last 3 Encounters:  11/09/15 (!) 168/53  07/13/15 (!) 142/52  05/25/15 (!) 182/61    Lab Results  Component Value Date   NA 141 07/13/2015   K 3.8 07/13/2015   CREATININE 1.03 (H) 07/13/2015    Assessment: Blood pressure control:  poorly controlled Progress toward BP goal:   deteriorated Comments: did not take her HCTZ or lisinopril this AM  Plan: Medications:  would c/w HCTZ and metoprolol and increase her lisinopril to 5 mg Educational resources provided:   Self management tools provided:   Other plans: Patient to follow up in 3 months

## 2015-11-09 NOTE — Patient Instructions (Addendum)
-   It was a pleasure seeing you today - Your diabetes is well controlled. Continue with your medications - Please start using the terbinafine cream on your feet for your fungal infection - Please follow up with podiatry for your nail trimming - Your BP is very high today. Please take your all your BP medications. We will increase your lisinopril to 5 mg.  - Will check blood work on your next visit

## 2015-11-09 NOTE — Assessment & Plan Note (Signed)
Lab Results  Component Value Date   HGBA1C 6.6 11/09/2015   HGBA1C 7.2 05/25/2015   HGBA1C 6.5 10/08/2014     Assessment: Diabetes control:  well controlled Progress toward A1C goal:   at goal Comments: compliant with glipizide  Plan: Medications:  continue current medications Home glucose monitoring: Frequency:   Timing:   Instruction/counseling given: discussed foot care Educational resources provided:   Self management tools provided:   Other plans: No hypoglycemic episodes reported. Will c/w glipizide for now

## 2015-11-09 NOTE — Assessment & Plan Note (Signed)
-   Patient had been prescribed terbinafine cream but has not yet started it - States she has the medication at home - Will need to apply twice daily for 2-4 weeks

## 2015-11-09 NOTE — Assessment & Plan Note (Signed)
-   Patient with long toenails on foot exam - Will need to f/u with podiatry for toe nail clipping - She states she will make an appointment with podiatry for follow up

## 2015-11-09 NOTE — Progress Notes (Signed)
   Subjective:    Patient ID: Leah Kaufman, female    DOB: [birth year], 82 y.o.   MRN: [id number]  HPI Patient seen and examined. She is here for routine follow up of her diabetes and HTN. She states that she is usually compliant with her meds but has only taken her metoprolol this AM and had not taken her HCTZ or lisinopril. She was noted to have SBPs in the 190s initially which improved to the 160s. Patient also has picked up her terbinafine cream for her tinea pedis but has not used it yet. She states she has no complaints and feels well   Review of Systems  Constitutional: Negative.   HENT: Negative.   Respiratory: Negative.   Cardiovascular: Negative.   Gastrointestinal: Negative.   Musculoskeletal: Negative.   Skin: Positive for rash.       Tinea pedis infection over the soles of her feet  Neurological: Negative.   Psychiatric/Behavioral: Negative.        Objective:   Physical Exam  Constitutional: She is oriented to person, place, and time.  Poorly nourished, low BMI  HENT:  Head: Normocephalic and atraumatic.  Neck: Normal range of motion.  Cardiovascular: Normal rate, regular rhythm and normal heart sounds.   Pulmonary/Chest: Effort normal and breath sounds normal. No respiratory distress. She has no wheezes.  Abdominal: Soft. Bowel sounds are normal. She exhibits no distension. There is no tenderness.  Musculoskeletal: Normal range of motion. She exhibits no edema.  2 + dorsalis pedis pulses b/l  Lymphadenopathy:    She has no cervical adenopathy.  Neurological: She is alert and oriented to person, place, and time.  Skin: Skin is warm. Rash noted.  B/l soles of feet are erythematous and scaly  Psychiatric: She has a normal mood and affect. Her behavior is normal.          Assessment & Plan:  Please see problem based cahrting for assessment and plan:

## 2015-11-19 ENCOUNTER — Other Ambulatory Visit: Payer: Self-pay | Admitting: Internal Medicine

## 2016-02-06 ENCOUNTER — Other Ambulatory Visit: Payer: Self-pay | Admitting: Internal Medicine

## 2016-03-07 ENCOUNTER — Encounter: Payer: Self-pay | Admitting: Internal Medicine

## 2016-03-07 ENCOUNTER — Encounter: Payer: Self-pay | Admitting: Licensed Clinical Social Worker

## 2016-03-07 ENCOUNTER — Ambulatory Visit (INDEPENDENT_AMBULATORY_CARE_PROVIDER_SITE_OTHER): Payer: Medicare PPO | Admitting: Internal Medicine

## 2016-03-07 VITALS — BP 159/77 | HR 71 | Temp 97.4°F | Ht 63.0 in | Wt 95.2 lb

## 2016-03-07 DIAGNOSIS — I10 Essential (primary) hypertension: Secondary | ICD-10-CM

## 2016-03-07 DIAGNOSIS — N390 Urinary tract infection, site not specified: Secondary | ICD-10-CM | POA: Diagnosis not present

## 2016-03-07 DIAGNOSIS — B353 Tinea pedis: Secondary | ICD-10-CM

## 2016-03-07 DIAGNOSIS — E119 Type 2 diabetes mellitus without complications: Secondary | ICD-10-CM | POA: Diagnosis not present

## 2016-03-07 DIAGNOSIS — Z0000 Encounter for general adult medical examination without abnormal findings: Secondary | ICD-10-CM

## 2016-03-07 DIAGNOSIS — Z9114 Patient's other noncompliance with medication regimen: Secondary | ICD-10-CM

## 2016-03-07 DIAGNOSIS — B351 Tinea unguium: Secondary | ICD-10-CM

## 2016-03-07 DIAGNOSIS — F17210 Nicotine dependence, cigarettes, uncomplicated: Secondary | ICD-10-CM

## 2016-03-07 DIAGNOSIS — B9689 Other specified bacterial agents as the cause of diseases classified elsewhere: Secondary | ICD-10-CM | POA: Diagnosis not present

## 2016-03-07 LAB — POCT URINALYSIS DIPSTICK
Bilirubin, UA: NEGATIVE
Blood, UA: NEGATIVE
GLUCOSE UA: NEGATIVE
Ketones, UA: NEGATIVE
Nitrite, UA: POSITIVE
PROTEIN UA: NEGATIVE
SPEC GRAV UA: 1.025
UROBILINOGEN UA: 0.2
pH, UA: 6.5

## 2016-03-07 LAB — POCT GLYCOSYLATED HEMOGLOBIN (HGB A1C): HEMOGLOBIN A1C: 7.9

## 2016-03-07 LAB — GLUCOSE, CAPILLARY: Glucose-Capillary: 171 mg/dL — ABNORMAL HIGH (ref 65–99)

## 2016-03-07 MED ORDER — CEFUROXIME AXETIL 250 MG PO TABS: 250.0000 mg | ORAL_TABLET | Freq: Two times a day (BID) | ORAL | 0 refills | Status: DC

## 2016-03-07 NOTE — Assessment & Plan Note (Signed)
-   flu shot given today 

## 2016-03-07 NOTE — Assessment & Plan Note (Addendum)
BP Readings from Last 3 Encounters:  03/07/16 (!) 159/77  11/09/15 (!) 168/53  07/13/15 (!) 142/52    Lab Results  Component Value Date   NA 141 07/13/2015   K 3.8 07/13/2015   CREATININE 1.03 (H) 07/13/2015    Assessment: Blood pressure control:  poorly controlled Progress toward BP goal:   improved Comments: has not been taking her medications over the last month  Plan: Medications:  would restart her HCTZ, lisinopril and metoprolol Educational resources provided:   Self management tools provided:   Other plans: Explained importance of taking meds to patient and grandson and she will resume her meds today. Check BMP today

## 2016-03-07 NOTE — Progress Notes (Signed)
Clinical Social Work Assessment         Reason for consult:     Community Resources                             Housing/Transportation Living arrangements for the past 2 months:  Ms. Ringley lives independently in her own home.  For the past 3 weeks, Ms. Scorsone has been living with her grandson, Sharl Ma in [place].  Pt's plan is to return to her home this week.  Source of Information:  Patient and Grandson Significant Relationships:  Pt has 2 living children.  One son lives close to Ms. Trethewey; however, travels and is not home very often.  Pt has another child that does not live locally. Need for family participation in patient care:  Yolanda Bonine is involved and has been paying privately for pt's personal care services while patient has been living with him.  Grandson works 40+ hours a week.  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  Recently, the city of [place] informed pt her home was not acceptable and pt needed to remove items and make repairs.  Ms. Felling moved in with grandson at that time.  Yolanda Bonine states he has cleaned the home and removed some items; however, pt's home still has a stench from animal excrement.  Pt has a walker at her home.  Has been using a cane while at grandson's.  Grandson voiced concern regarding pt's ability for self care and home maintenance.  Grandson plans to continue with personal care services.     Social Worker assessment / plan:  CSW met with Ms. Kowalewski and her grandson, Sharl Ma.  Pt's goals is to return to her home and be with her 2 dogs.  CSW discussed [facility] services once pt returns home, pt hesitant but will think about services.  Grandson plans to set up Meals on Wheels today through [employer].  CSW provided information on [facility], PACE, medical alert, and [facility].  CSW inquiring if pt would be eligible for [facility] services, as grandson voiced concern pt may forget to take her medications  or eat.  Insurance information:  [employer], most likely over income for [employer]  Information / Referral to community resources:  [employer] - additional medical alert information and resources, [employer] Program - General POA, [place], [facility], [place] and [facility] (if eligible). Pt/family provided with CSW contact information and aware CSW is available as needed.

## 2016-03-07 NOTE — Assessment & Plan Note (Signed)
Lab Results  Component Value Date   HGBA1C 7.9 03/07/2016   HGBA1C 6.6 11/09/2015   HGBA1C 7.2 05/25/2015     Assessment: Diabetes control:  fair Progress toward A1C goal:   deteriorated Comments: questionable compliance with glipizide over the last month  Plan: Medications:  restart glipizide Home glucose monitoring: Frequency:   Timing:   Instruction/counseling given: reminded to bring medications to each visit and discussed foot care Educational resources provided:   Self management tools provided:   Other plans: Will check BMP today

## 2016-03-07 NOTE — Assessment & Plan Note (Signed)
-   Will need to be referred to podiatry again for toenail debridement - Scheduled an appointment for Monday so grandson can take her

## 2016-03-07 NOTE — Progress Notes (Signed)
   Subjective:    Patient ID: Leah Kaufman, female    DOB: [birth year], 82 y.o.   MRN: [id number]  HPI  Patient seen and examined. She is here for routine follow up of her DM and HTN.  She has been staying with her grandson for the last 3 weeks and he is at bedside. He states that she has been getting a little confused and he has noted that she has had urinary incontinence and foul smelling urine. She states that she feels like she has a "kidney infection". No fevers, no flank pain currently She has not been taking her BP medications for the last 3 weeks. States she does occasionally take her glipizide and asa.  Her grandson has been cooking her food and giving her Boost and states that she has started putting on weight again. She has put on 9 lbs since her last appointment. She also notes that her toenails need to be clipped again and wants a referral to podiatry   Review of Systems  Constitutional: Negative for chills, diaphoresis and fatigue.  HENT: Negative.   Respiratory: Negative.   Cardiovascular: Positive for leg swelling. Negative for chest pain.  Gastrointestinal: Negative.   Musculoskeletal: Negative.   Skin: Negative.   Neurological: Negative.   Psychiatric/Behavioral: Negative.        Objective:   Physical Exam  Constitutional: She is oriented to person, place, and time. No distress.  Poorly nourished  HENT:  Head: Normocephalic and atraumatic.  Cardiovascular: Normal rate, regular rhythm and normal heart sounds.   Pulmonary/Chest: Effort normal and breath sounds normal. No respiratory distress. She has no wheezes.  Abdominal: Soft. Bowel sounds are normal. She exhibits no distension. There is no tenderness.  Musculoskeletal: Normal range of motion. She exhibits edema.  2 + b/l LE pitting edema  Neurological: She is alert and oriented to person, place, and time.  Skin: Skin is warm.  - B/l feet with onychomycosis and erythema over b/l soles consistent with a  fungal infection - No skin breaks noted  Psychiatric: She has a normal mood and affect. Her behavior is normal.          Assessment & Plan:  Please see problem based charting for assessment and plan:

## 2016-03-07 NOTE — Patient Instructions (Addendum)
-   It was a pleasure seeing you today - You have a Urinary tract infection. We will start you on an antibiotic. Take 1 tab twice a day for 5 days - If you have persistent symptoms please follow up in the clinic - Please continue with your diabetes medications. - We will refer you to podiatry for clipping of your toenails - We will check some bloodwork today

## 2016-03-07 NOTE — Assessment & Plan Note (Signed)
-   patient with urinary incontinence, increased frequency and foul smelling urine - Urine dipstick positive for leukocytes and nitrite - Patient with likely UTI. Will treat with cefuroxime bid for 5 days - Last urine cx in feb grew E. Coli which was sensitive to this - Patient to follow up if no improvement

## 2016-03-07 NOTE — Assessment & Plan Note (Signed)
-   Patient has not been compliant with lamisil cream - Reiterated that she needs to use this twice a day to the soles of her feet

## 2016-03-08 ENCOUNTER — Telehealth: Payer: Self-pay | Admitting: Internal Medicine

## 2016-03-08 LAB — BMP8+ANION GAP
Anion Gap: 17 mmol/L (ref 10.0–18.0)
BUN/Creatinine Ratio: 30 — ABNORMAL HIGH (ref 12–28)
BUN: 26 mg/dL (ref 8–27)
CALCIUM: 10 mg/dL (ref 8.7–10.3)
CHLORIDE: 98 mmol/L (ref 96–106)
CO2: 26 mmol/L (ref 18–29)
Creatinine, Ser: 0.86 mg/dL (ref 0.57–1.00)
GFR calc non Af Amer: 62 mL/min/{1.73_m2} (ref >59)
GFR, EST AFRICAN AMERICAN: 71 mL/min/{1.73_m2} (ref >59)
GLUCOSE: 171 mg/dL — AB (ref 65–99)
POTASSIUM: 4.4 mmol/L (ref 3.5–5.2)
SODIUM: 141 mmol/L (ref 134–144)

## 2016-03-08 NOTE — Telephone Encounter (Signed)
Called patient to discuss blood work results with her. She says she has been feeling weak and has not yet started taking her antibiotics for her UTI. I advised her to start her abx and if she does not feel better in 1-2 days [facility]. She expresses understanding and states that she will start her abx today. Will f/u urine cx. Lab work reviewed with patient. No further work up for now

## 2016-03-10 ENCOUNTER — Other Ambulatory Visit: Payer: Self-pay | Admitting: Internal Medicine

## 2016-03-10 ENCOUNTER — Telehealth: Payer: Self-pay

## 2016-03-10 MED ORDER — CEFUROXIME AXETIL 250 MG PO TABS: 250.0000 mg | ORAL_TABLET | Freq: Two times a day (BID) | ORAL | 0 refills | Status: AC

## 2016-03-10 NOTE — Telephone Encounter (Signed)
Will resend the prescription to the pharmacy

## 2016-03-10 NOTE — Telephone Encounter (Signed)
Pt granddaughter needs to speak with a nurse regarding meds.

## 2016-03-10 NOTE — Telephone Encounter (Signed)
Patient's grand son reports that antibiotic(Ceftin rx written 03/07/2016) is lost only took 1 or 2 pills would like to see if can get another bottle

## 2016-03-15 ENCOUNTER — Other Ambulatory Visit: Payer: Self-pay | Admitting: Internal Medicine

## 2016-03-23 LAB — HM DIABETES EYE EXAM

## 2016-03-29 ENCOUNTER — Encounter: Payer: Self-pay | Admitting: *Deleted

## 2016-06-12 ENCOUNTER — Telehealth: Payer: Self-pay | Admitting: Internal Medicine

## 2016-06-12 NOTE — Telephone Encounter (Signed)
APT. REMINDER CALL, LMTCB °

## 2016-06-13 ENCOUNTER — Encounter: Payer: Self-pay | Admitting: Internal Medicine

## 2016-06-13 ENCOUNTER — Ambulatory Visit (INDEPENDENT_AMBULATORY_CARE_PROVIDER_SITE_OTHER): Payer: PPO | Admitting: Internal Medicine

## 2016-06-13 VITALS — BP 146/78 | HR 92 | Temp 97.4°F | Ht 63.0 in | Wt 86.7 lb

## 2016-06-13 DIAGNOSIS — I10 Essential (primary) hypertension: Secondary | ICD-10-CM | POA: Diagnosis not present

## 2016-06-13 DIAGNOSIS — Z681 Body mass index (BMI) 19 or less, adult: Secondary | ICD-10-CM

## 2016-06-13 DIAGNOSIS — Z716 Tobacco abuse counseling: Secondary | ICD-10-CM | POA: Diagnosis not present

## 2016-06-13 DIAGNOSIS — B351 Tinea unguium: Secondary | ICD-10-CM

## 2016-06-13 DIAGNOSIS — Z9114 Patient's other noncompliance with medication regimen: Secondary | ICD-10-CM | POA: Diagnosis not present

## 2016-06-13 DIAGNOSIS — E119 Type 2 diabetes mellitus without complications: Secondary | ICD-10-CM | POA: Diagnosis not present

## 2016-06-13 DIAGNOSIS — R634 Abnormal weight loss: Secondary | ICD-10-CM | POA: Insufficient documentation

## 2016-06-13 DIAGNOSIS — Z79899 Other long term (current) drug therapy: Secondary | ICD-10-CM

## 2016-06-13 DIAGNOSIS — J449 Chronic obstructive pulmonary disease, unspecified: Secondary | ICD-10-CM | POA: Diagnosis not present

## 2016-06-13 DIAGNOSIS — F17210 Nicotine dependence, cigarettes, uncomplicated: Secondary | ICD-10-CM | POA: Diagnosis not present

## 2016-06-13 DIAGNOSIS — F17219 Nicotine dependence, cigarettes, with unspecified nicotine-induced disorders: Secondary | ICD-10-CM

## 2016-06-13 DIAGNOSIS — B353 Tinea pedis: Secondary | ICD-10-CM | POA: Diagnosis not present

## 2016-06-13 LAB — GLUCOSE, CAPILLARY: Glucose-Capillary: 162 mg/dL — ABNORMAL HIGH (ref 65–99)

## 2016-06-13 MED ORDER — NICOTINE POLACRILEX 4 MG MT GUM: 4.0000 mg | CHEWING_GUM | OROMUCOSAL | 0 refills | Status: AC | PRN

## 2016-06-13 MED ORDER — HYDROCHLOROTHIAZIDE 25 MG PO TABS: 25.0000 mg | ORAL_TABLET | Freq: Every day | ORAL | 2 refills | Status: AC

## 2016-06-13 MED ORDER — TERBINAFINE HCL 1 % EX CREA: 1.0000 "application " | TOPICAL_CREAM | Freq: Two times a day (BID) | CUTANEOUS | 0 refills | Status: AC

## 2016-06-13 MED ORDER — NICOTINE 14 MG/24HR TD PT24: 14.0000 mg | MEDICATED_PATCH | TRANSDERMAL | 0 refills | Status: AC

## 2016-06-13 MED ORDER — GLIPIZIDE 5 MG PO TABS: ORAL_TABLET | ORAL | 3 refills | Status: AC

## 2016-06-13 NOTE — Assessment & Plan Note (Signed)
BP Readings from Last 3 Encounters:  06/13/16 (!) 146/78  03/07/16 (!) 159/77  11/09/15 (!) 168/53    Lab Results  Component Value Date   NA 141 03/07/2016   K 4.4 03/07/2016   CREATININE 0.86 03/07/2016    Assessment: Blood pressure control:  fair Progress toward BP goal:   improved Comments: Patient is only taking lisinopril 5 mg. She has not been compliant with HCTZ or metoprolol  Plan: Medications:  will restart HCTZ and metoprolol Educational resources provided: brochure (denies ) Self management tools provided:   Other plans: will check BMP on next visit

## 2016-06-13 NOTE — Assessment & Plan Note (Signed)
-   Patient has 9 lb weight loss over the last 3-4 months - This is possibly secondary to poor PO intake given she lives by herself but given her smoking history will need to rule out underlying malignancy with CT chest

## 2016-06-13 NOTE — Progress Notes (Signed)
   Subjective:    Patient ID: Leah Kaufman, female    DOB: 1929-01-04, 83 y.o.   MRN: [id number]  HPI  I have seen and examined this patient. Patient is here for routine follow up of her HTN.  She is unsure of the medication she is supposed to be on and has only filled her lisinopril recently for her BP but has not filled her HCTZ or metoprolol in months. She has also not used her lamisil for her fungal infection of her feet.She also complains of weight loss and is down 9 lbs since her last visit in November. She has no other complaints at this time.     Review of Systems  Constitutional: Positive for unexpected weight change. Negative for activity change, chills and fatigue.       Weight loss approx 9 lbs over 3-4 months  HENT: Negative.   Respiratory: Negative.   Cardiovascular: Negative.   Gastrointestinal: Negative.   Musculoskeletal: Negative.   Neurological: Negative.   Psychiatric/Behavioral: Negative.        Objective:   Physical Exam  Constitutional: She is oriented to person, place, and time.  Underweight and poorly nourished  HENT:  Head: Normocephalic and atraumatic.  Mouth/Throat: No oropharyngeal exudate.  Neck: Neck supple.  Cardiovascular: Normal rate, regular rhythm and normal heart sounds.   Pulmonary/Chest: Effort normal and breath sounds normal. She has no wheezes. She has no rales.  Abdominal: Soft. Bowel sounds are normal. She exhibits no distension. There is no tenderness.  Musculoskeletal: Normal range of motion. She exhibits edema.  1 + b/l LE pitting edema  Lymphadenopathy:    She has no cervical adenopathy.  Neurological: She is alert and oriented to person, place, and time.  Skin: Skin is warm. No erythema.  Psychiatric: She has a normal mood and affect. Her behavior is normal.          Assessment & Plan:  Please see problem based charting for assessment and plan:

## 2016-06-13 NOTE — Assessment & Plan Note (Addendum)
  Assessment: Progress toward smoking cessation:   no change Barriers to progress toward smoking cessation:   patient is unsure if she wants to quit at this time Comments: Discussed the importance of smoking cessation. Given her weight loss and smoking history will obtain CT chest to rule out underlying malignancy  Plan: Instruction/counseling given:  I counseled patient on the dangers of tobacco use, advised patient to stop smoking, and reviewed strategies to maximize success. Educational resources provided:  [employer], [employer](1-800-QUIT-NOW) brochure (denies ) Self management tools provided:    Medications to assist with smoking cessation:  Nicotine Patch and nicotine gum Patient agreed to the following self-care plans for smoking cessation: cut down the number of cigarettes smoked, set a quit date and stop smoking  Other plans: Will obtain CT chest to rule out underlying malignancy given weight loss and smoking history. Time spent on smoking cessation counseling was 8 minutes

## 2016-06-13 NOTE — Assessment & Plan Note (Signed)
-   symptoms are well controlled on albuterol prn - Would check PFTs on next visit - Patient still smoking. Spoke about importance of smoking cessation with patient who will attempt to quit

## 2016-06-13 NOTE — Assessment & Plan Note (Signed)
-   Last HbA1C was 7.9 - She has been non compliant with glipizide  - Refilled glipizide today and discussed importance of compliance - Will recheck A1C on follow up

## 2016-06-13 NOTE — Patient Instructions (Signed)
-   It was a pleasure seeing you today - Your blood pressure is mildly elevated today. We will refill your medications today. You are on lisinopril, hydrochlorothiazide and metoprolol for your BP - Please try and stop smoking. We will prescribe the nicotine patch and gum for you - We will also order a CT scan of your lungs given your weight loss and smoking history

## 2016-06-13 NOTE — Assessment & Plan Note (Signed)
-   patient followed up with podiatry - Toe nails have been clipped -  She will need follow up with podiatry for proper foot care

## 2016-06-13 NOTE — Assessment & Plan Note (Signed)
-   Patient with persistent erythema and scaling of soles of foot - She has not used lamisil cream prescribed for her - Will re prescribe this and emphasized the importance of compliance - Patient expressed understanding

## 2016-08-03 ENCOUNTER — Ambulatory Visit (HOSPITAL_COMMUNITY): Payer: PPO | Attending: Internal Medicine

## 2016-09-01 ENCOUNTER — Other Ambulatory Visit: Payer: Self-pay | Admitting: Internal Medicine

## 2016-11-17 ENCOUNTER — Telehealth: Payer: Self-pay | Admitting: *Deleted

## 2016-11-17 NOTE — Telephone Encounter (Signed)
Thank you Leah Kaufman 

## 2016-11-17 NOTE — Telephone Encounter (Signed)
[facility] calls and states pt was pronounced dead at 1344 today, they will send the death certificate over

## 2016-12-08 ENCOUNTER — Other Ambulatory Visit: Payer: Self-pay | Admitting: Internal Medicine

## 2016-12-09 DEATH — deceased

## 2016-12-11 ENCOUNTER — Telehealth: Payer: Self-pay | Admitting: Internal Medicine

## 2016-12-12 NOTE — Telephone Encounter (Signed)
Information has been forwarded to Research officer, political partypractice administrator, Tyler Aasoris.

## 2016-12-12 NOTE — Telephone Encounter (Signed)
Per chart, pt is deceased-refill request denied.Criss AlvineGoldston, Darlene Cassady9/4/201810:23 AM
